# Patient Record
Sex: Male | Born: 1995
Health system: Southern US, Community
[De-identification: ages and names within clinical notes are randomized; demographics above are authoritative.]

## PROBLEM LIST (undated history)

## (undated) DIAGNOSIS — J45909 Unspecified asthma, uncomplicated: Secondary | ICD-10-CM

## (undated) DIAGNOSIS — L309 Dermatitis, unspecified: Secondary | ICD-10-CM

## (undated) HISTORY — PX: NO PAST SURGERIES: SHX2092

## (undated) HISTORY — DX: Unspecified asthma, uncomplicated: J45.909

---
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
## 2015-07-07 ENCOUNTER — Telehealth: Payer: Self-pay

## 2015-07-07 NOTE — Telephone Encounter (Signed)
Attempted pre visit call, mailbox was full.

## 2015-07-09 ENCOUNTER — Ambulatory Visit (INDEPENDENT_AMBULATORY_CARE_PROVIDER_SITE_OTHER): Payer: 59 | Admitting: Physician Assistant

## 2015-07-09 ENCOUNTER — Encounter: Payer: Self-pay | Admitting: Physician Assistant

## 2015-07-09 VITALS — BP 100/56 | HR 66 | Temp 98.1°F | Ht 68.25 in | Wt 212.4 lb

## 2015-07-09 DIAGNOSIS — J4520 Mild intermittent asthma, uncomplicated: Secondary | ICD-10-CM

## 2015-07-09 DIAGNOSIS — L700 Acne vulgaris: Secondary | ICD-10-CM

## 2015-07-09 NOTE — Progress Notes (Signed)
   Patient presents to clinic today to establish care.  Acute Concerns: Patient c/o acne of chest and back that has been present over the past couple of years but worsening over the past year. Has not tried anything for symptoms but has increased water intake.  Chronic Issues: Patient with history of asthma, mild intermittent. Denies nighttime awakenings. Uses albuterol inhaler 2-3 times per year. Denies exercise-induced symptoms. Symptoms usually only flare-up with exposure to cold air.  Past Medical History  Diagnosis Date  . Asthma     very mild -- rare albuterol use    Past Surgical History  Procedure Laterality Date  . No past surgeries      No current outpatient prescriptions on file prior to visit.   No current facility-administered medications on file prior to visit.    No Known Allergies  Family History  Problem Relation Age of Onset  . Healthy Mother   . Healthy Father   . Healthy Brother     x 4  . Healthy Sister     x 2    History   Social History  . Marital Status: Single    Spouse Name: N/A  . Number of Children: 0  . Years of Education: N/A   Occupational History  . Student     Primary school teacher   Social History Main Topics  . Smoking status: Never Smoker   . Smokeless tobacco: Never Used  . Alcohol Use: No  . Drug Use: No  . Sexual Activity:    Partners: Female     Comment: condoms   Other Topics Concern  . Not on file   Social History Narrative   Review of Systems  Constitutional: Negative for fever, weight loss and malaise/fatigue.  Respiratory: Negative for cough, hemoptysis, sputum production, shortness of breath and wheezing.   Cardiovascular: Negative for chest pain and palpitations.  Skin: Negative for itching and rash.    BP 100/56 mmHg  Pulse 66  Temp(Src) 98.1 F (36.7 C) (Oral)  Ht 5' 8.25" (1.734 m)  Wt 212 lb 6.4 oz (96.344 kg)  BMI 32.04 kg/m2  SpO2 98%  Physical Exam  Constitutional: He is oriented to person,  place, and time and well-developed, well-nourished, and in no distress.  HENT:  Head: Normocephalic and atraumatic.  Eyes: Conjunctivae are normal.  Neck: Neck supple.  Cardiovascular: Normal rate, regular rhythm, normal heart sounds and intact distal pulses.   Pulmonary/Chest: Effort normal and breath sounds normal. No respiratory distress. He has no wheezes. He has no rales. He exhibits no tenderness.  Neurological: He is alert and oriented to person, place, and time.  Skin: Skin is warm and dry.  Pustular acne noted on chest and upper back. No evidence of scarring noted.  Vitals reviewed.   No results found for this or any previous visit (from the past 2160 hour(s)).  Assessment/Plan: Asthma, mild intermittent, well-controlled Continue albuterol inhaler PRN.  Acne vulgaris Has never tried OTC acne medications. Recommend washing with Cetaphil Antibacterial soap twice weekly at bath time. Use Neutrogena or other brand of acne body wash daily. Continue good hydration with water. Follow-up if not improving.

## 2015-07-09 NOTE — Patient Instructions (Signed)
Please stay well hydrated as this will help with acne.  Start using Cetaphil Antibacterial wash twice weekly. Also get some Neutrogena Acen body wash and use as directed. Try this regimen for 1-2 months. If no improvement, come back and see me and we can discuss prescription medications.  For the knee this is arthritis from playing football (all the wear and tear/impact on your joints has effects!). Apply some Aspercreme or Icy Hot to the knees before practices/games. Aleve if needed for pain. If symptoms worsen, we will need to x-ray your knees.  Follow-up with me yearly for a physical and when you need me for sick visits.

## 2015-07-09 NOTE — Assessment & Plan Note (Signed)
Continue albuterol inhaler PRN.

## 2015-07-09 NOTE — Assessment & Plan Note (Signed)
Has never tried OTC acne medications. Recommend washing with Cetaphil Antibacterial soap twice weekly at bath time. Use Neutrogena or other brand of acne body wash daily. Continue good hydration with water. Follow-up if not improving.

## 2015-07-09 NOTE — Progress Notes (Signed)
Pre visit review using our clinic review tool, if applicable. No additional management support is needed unless otherwise documented below in the visit note. 

## 2016-07-04 ENCOUNTER — Ambulatory Visit: Payer: Self-pay | Admitting: Physician Assistant

## 2016-07-05 ENCOUNTER — Encounter: Payer: Self-pay | Admitting: Physician Assistant

## 2016-07-05 ENCOUNTER — Ambulatory Visit (INDEPENDENT_AMBULATORY_CARE_PROVIDER_SITE_OTHER): Payer: 59 | Admitting: Physician Assistant

## 2016-07-05 VITALS — BP 102/70 | HR 55 | Temp 97.7°F | Resp 16 | Ht 68.0 in | Wt 202.2 lb

## 2016-07-05 DIAGNOSIS — Z114 Encounter for screening for human immunodeficiency virus [HIV]: Secondary | ICD-10-CM | POA: Diagnosis not present

## 2016-07-05 DIAGNOSIS — Z0000 Encounter for general adult medical examination without abnormal findings: Secondary | ICD-10-CM | POA: Diagnosis not present

## 2016-07-05 LAB — URINALYSIS, ROUTINE W REFLEX MICROSCOPIC
Bilirubin Urine: NEGATIVE
HGB URINE DIPSTICK: NEGATIVE
Ketones, ur: NEGATIVE
LEUKOCYTES UA: NEGATIVE
Nitrite: NEGATIVE
RBC / HPF: NONE SEEN (ref 0–?)
Specific Gravity, Urine: 1.02 (ref 1.000–1.030)
TOTAL PROTEIN, URINE-UPE24: NEGATIVE
Urine Glucose: NEGATIVE
Urobilinogen, UA: 0.2 (ref 0.0–1.0)
WBC UA: NONE SEEN (ref 0–?)
pH: 6.5 (ref 5.0–8.0)

## 2016-07-05 LAB — COMPREHENSIVE METABOLIC PANEL
ALT: 16 U/L (ref 0–53)
AST: 16 U/L (ref 0–37)
Albumin: 4.2 g/dL (ref 3.5–5.2)
Alkaline Phosphatase: 56 U/L (ref 39–117)
BILIRUBIN TOTAL: 0.6 mg/dL (ref 0.2–1.2)
BUN: 11 mg/dL (ref 6–23)
CALCIUM: 9.4 mg/dL (ref 8.4–10.5)
CHLORIDE: 103 meq/L (ref 96–112)
CO2: 30 meq/L (ref 19–32)
Creatinine, Ser: 1.18 mg/dL (ref 0.40–1.50)
GFR: 101.04 mL/min (ref >60.00)
Glucose, Bld: 92 mg/dL (ref 70–99)
Potassium: 4.2 mEq/L (ref 3.5–5.1)
Sodium: 137 mEq/L (ref 135–145)
Total Protein: 7 g/dL (ref 6.0–8.3)

## 2016-07-05 LAB — CBC
HEMATOCRIT: 43.4 % (ref 39.0–52.0)
HEMOGLOBIN: 14.6 g/dL (ref 13.0–17.0)
MCHC: 33.8 g/dL (ref 30.0–36.0)
MCV: 84.6 fl (ref 78.0–100.0)
PLATELETS: 285 10*3/uL (ref 150.0–400.0)
RBC: 5.13 Mil/uL (ref 4.22–5.81)
RDW: 13.9 % (ref 11.5–14.6)
WBC: 4.5 10*3/uL (ref 4.5–10.5)

## 2016-07-05 LAB — LIPID PANEL
CHOL/HDL RATIO: 3
CHOLESTEROL: 122 mg/dL (ref 0–200)
HDL: 38.4 mg/dL — AB (ref >39.00)
LDL CALC: 74 mg/dL (ref 0–99)
NonHDL: 83.22
TRIGLYCERIDES: 47 mg/dL (ref 0.0–149.0)
VLDL: 9.4 mg/dL (ref 0.0–40.0)

## 2016-07-05 MED ORDER — PROAIR HFA 108 (90 BASE) MCG/ACT IN AERS: 1.0000 | INHALATION_SPRAY | Freq: Four times a day (QID) | RESPIRATORY_TRACT | 1 refills | Status: DC | PRN

## 2016-07-05 NOTE — Patient Instructions (Signed)
Please go to the lab for blood work.   Our office will call you with your results unless you have chosen to receive results via MyChart.  If your blood work is normal we will follow-up each year for physicals and as scheduled for chronic medical problems.  If anything is abnormal we will treat accordingly and get you in for a follow-up.  Keep up with exercise and follow the meal planning guide I have given you to help promote healthy weight.  Preventive Care for Adults, Male A healthy lifestyle and preventive care can promote health and wellness. Preventive health guidelines for men include the following key practices:  A routine yearly physical is a good way to check with your health care provider about your health and preventative screening. It is a chance to share any concerns and updates on your health and to receive a thorough exam.  Visit your dentist for a routine exam and preventative care every 6 months. Brush your teeth twice a day and floss once a day. Good oral hygiene prevents tooth decay and gum disease.  The frequency of eye exams is based on your age, health, family medical history, use of contact lenses, and other factors. Follow your health care provider's recommendations for frequency of eye exams.  Eat a healthy diet. Foods such as vegetables, fruits, whole grains, low-fat dairy products, and lean protein foods contain the nutrients you need without too many calories. Decrease your intake of foods high in solid fats, added sugars, and salt. Eat the right amount of calories for you.Get information about a proper diet from your health care provider, if necessary.  Regular physical exercise is one of the most important things you can do for your health. Most adults should get at least 150 minutes of moderate-intensity exercise (any activity that increases your heart rate and causes you to sweat) each week. In addition, most adults need muscle-strengthening exercises on 2 or  more days a week.  Maintain a healthy weight. The body mass index (BMI) is a screening tool to identify possible weight problems. It provides an estimate of body fat based on height and weight. Your health care provider can find your BMI and can help you achieve or maintain a healthy weight.For adults 20 years and older:  A BMI below 18.5 is considered underweight.  A BMI of 18.5 to 24.9 is normal.  A BMI of 25 to 29.9 is considered overweight.  A BMI of 30 and above is considered obese.  Maintain normal blood lipids and cholesterol levels by exercising and minimizing your intake of saturated fat. Eat a balanced diet with plenty of fruit and vegetables. Blood tests for lipids and cholesterol should begin at age 76 and be repeated every 5 years. If your lipid or cholesterol levels are high, you are over 50, or you are at high risk for heart disease, you may need your cholesterol levels checked more frequently.Ongoing high lipid and cholesterol levels should be treated with medicines if diet and exercise are not working.  If you smoke, find out from your health care provider how to quit. If you do not use tobacco, do not start.  Lung cancer screening is recommended for adults aged 55-80 years who are at high risk for developing lung cancer because of a history of smoking. A yearly low-dose CT scan of the lungs is recommended for people who have at least a 30-pack-year history of smoking and are a current smoker or have quit within the past  15 years. A pack year of smoking is smoking an average of 1 pack of cigarettes a day for 1 year (for example: 1 pack a day for 30 years or 2 packs a day for 15 years). Yearly screening should continue until the smoker has stopped smoking for at least 15 years. Yearly screening should be stopped for people who develop a health problem that would prevent them from having lung cancer treatment.  If you choose to drink alcohol, do not have more than 2 drinks per day.  One drink is considered to be 12 ounces (355 mL) of beer, 5 ounces (148 mL) of wine, or 1.5 ounces (44 mL) of liquor.  Avoid use of street drugs. Do not share needles with anyone. Ask for help if you need support or instructions about stopping the use of drugs.  High blood pressure causes heart disease and increases the risk of stroke. Your blood pressure should be checked at least every 1-2 years. Ongoing high blood pressure should be treated with medicines, if weight loss and exercise are not effective.  If you are 100-12 years old, ask your health care provider if you should take aspirin to prevent heart disease.  Diabetes screening is done by taking a blood sample to check your blood glucose level after you have not eaten for a certain period of time (fasting). If you are not overweight and you do not have risk factors for diabetes, you should be screened once every 3 years starting at age 61. If you are overweight or obese and you are 3-27 years of age, you should be screened for diabetes every year as part of your cardiovascular risk assessment.  Colorectal cancer can be detected and often prevented. Most routine colorectal cancer screening begins at the age of 71 and continues through age 25. However, your health care provider may recommend screening at an earlier age if you have risk factors for colon cancer. On a yearly basis, your health care provider may provide home test kits to check for hidden blood in the stool. Use of a small camera at the end of a tube to directly examine the colon (sigmoidoscopy or colonoscopy) can detect the earliest forms of colorectal cancer. Talk to your health care provider about this at age 39, when routine screening begins. Direct exam of the colon should be repeated every 5-10 years through age 13, unless early forms of precancerous polyps or small growths are found.  People who are at an increased risk for hepatitis B should be screened for this virus. You are  considered at high risk for hepatitis B if:  You were born in a country where hepatitis B occurs often. Talk with your health care provider about which countries are considered high risk.  Your parents were born in a high-risk country and you have not received a shot to protect against hepatitis B (hepatitis B vaccine).  You have HIV or AIDS.  You use needles to inject street drugs.  You live with, or have sex with, someone who has hepatitis B.  You are a man who has sex with other men (MSM).  You get hemodialysis treatment.  You take certain medicines for conditions such as cancer, organ transplantation, and autoimmune conditions.  Hepatitis C blood testing is recommended for all people born from [birth year] through 1965 and any individual with known risks for hepatitis C.  Practice safe sex. Use condoms and avoid high-risk sexual practices to reduce the spread of sexually transmitted infections (STIs).  STIs include gonorrhea, chlamydia, syphilis, trichomonas, herpes, HPV, and human immunodeficiency virus (HIV). Herpes, HIV, and HPV are viral illnesses that have no cure. They can result in disability, cancer, and death.  If you are a man who has sex with other men, you should be screened at least once per year for:  HIV.  Urethral, rectal, and pharyngeal infection of gonorrhea, chlamydia, or both.  If you are at risk of being infected with HIV, it is recommended that you take a prescription medicine daily to prevent HIV infection. This is called preexposure prophylaxis (PrEP). You are considered at risk if:  You are a man who has sex with other men (MSM) and have other risk factors.  You are a heterosexual man, are sexually active, and are at increased risk for HIV infection.  You take drugs by injection.  You are sexually active with a partner who has HIV.  Talk with your health care provider about whether you are at high risk of being infected with HIV. If you choose to begin PrEP,  you should first be tested for HIV. You should then be tested every 3 months for as long as you are taking PrEP.  A one-time screening for abdominal aortic aneurysm (AAA) and surgical repair of large AAAs by ultrasound are recommended for men ages 80 to 34 years who are current or former smokers.  Healthy men should no longer receive prostate-specific antigen (PSA) blood tests as part of routine cancer screening. Talk with your health care provider about prostate cancer screening.  Testicular cancer screening is not recommended for adult males who have no symptoms. Screening includes self-exam, a health care provider exam, and other screening tests. Consult with your health care provider about any symptoms you have or any concerns you have about testicular cancer.  Use sunscreen. Apply sunscreen liberally and repeatedly throughout the day. You should seek shade when your shadow is shorter than you. Protect yourself by wearing long sleeves, pants, a wide-brimmed hat, and sunglasses year round, whenever you are outdoors.  Once a month, do a whole-body skin exam, using a mirror to look at the skin on your back. Tell your health care provider about new moles, moles that have irregular borders, moles that are larger than a pencil eraser, or moles that have changed in shape or color.  Stay current with required vaccines (immunizations).  Influenza vaccine. All adults should be immunized every year.  Tetanus, diphtheria, and acellular pertussis (Td, Tdap) vaccine. An adult who has not previously received Tdap or who does not know his vaccine status should receive 1 dose of Tdap. This initial dose should be followed by tetanus and diphtheria toxoids (Td) booster doses every 10 years. Adults with an unknown or incomplete history of completing a 3-dose immunization series with Td-containing vaccines should begin or complete a primary immunization series including a Tdap dose. Adults should receive a Td booster  every 10 years.  Varicella vaccine. An adult without evidence of immunity to varicella should receive 2 doses or a second dose if he has previously received 1 dose.  Human papillomavirus (HPV) vaccine. Males aged 11-21 years who have not received the vaccine previously should receive the 3-dose series. Males aged 22-26 years may be immunized. Immunization is recommended through the age of 79 years for any male who has sex with males and did not get any or all doses earlier. Immunization is recommended for any person with an immunocompromised condition through the age of 26 years if he  did not get any or all doses earlier. During the 3-dose series, the second dose should be obtained 4-8 weeks after the first dose. The third dose should be obtained 24 weeks after the first dose and 16 weeks after the second dose.  Zoster vaccine. One dose is recommended for adults aged 71 years or older unless certain conditions are present.  Measles, mumps, and rubella (MMR) vaccine. Adults born before 2 generally are considered immune to measles and mumps. Adults born in [birth year] or later should have 1 or more doses of MMR vaccine unless there is a contraindication to the vaccine or there is laboratory evidence of immunity to each of the three diseases. A routine second dose of MMR vaccine should be obtained at least 28 days after the first dose for students attending postsecondary schools, health care workers, or international travelers. People who received inactivated measles vaccine or an unknown type of measles vaccine during 1963-1967 should receive 2 doses of MMR vaccine. People who received inactivated mumps vaccine or an unknown type of mumps vaccine before 1979 and are at high risk for mumps infection should consider immunization with 2 doses of MMR vaccine. Unvaccinated health care workers born before [birth year] who lack laboratory evidence of measles, mumps, or rubella immunity or laboratory confirmation of disease  should consider measles and mumps immunization with 2 doses of MMR vaccine or rubella immunization with 1 dose of MMR vaccine.  Pneumococcal 13-valent conjugate (PCV13) vaccine. When indicated, a person who is uncertain of his immunization history and has no record of immunization should receive the PCV13 vaccine. All adults 23 years of age and older should receive this vaccine. An adult aged 6 years or older who has certain medical conditions and has not been previously immunized should receive 1 dose of PCV13 vaccine. This PCV13 should be followed with a dose of pneumococcal polysaccharide (PPSV23) vaccine. Adults who are at high risk for pneumococcal disease should obtain the PPSV23 vaccine at least 8 weeks after the dose of PCV13 vaccine. Adults older than 20 years of age who have normal immune system function should obtain the PPSV23 vaccine dose at least 1 year after the dose of PCV13 vaccine.  Pneumococcal polysaccharide (PPSV23) vaccine. When PCV13 is also indicated, PCV13 should be obtained first. All adults aged 58 years and older should be immunized. An adult younger than age 32 years who has certain medical conditions should be immunized. Any person who resides in a nursing home or long-term care facility should be immunized. An adult smoker should be immunized. People with an immunocompromised condition and certain other conditions should receive both PCV13 and PPSV23 vaccines. People with human immunodeficiency virus (HIV) infection should be immunized as soon as possible after diagnosis. Immunization during chemotherapy or radiation therapy should be avoided. Routine use of PPSV23 vaccine is not recommended for American Indians, [address], or people younger than 65 years unless there are medical conditions that require PPSV23 vaccine. When indicated, people who have unknown immunization and have no record of immunization should receive PPSV23 vaccine. One-time revaccination 5 years after the  first dose of PPSV23 is recommended for people aged 19-64 years who have chronic kidney failure, nephrotic syndrome, asplenia, or immunocompromised conditions. People who received 1-2 doses of PPSV23 before age 11 years should receive another dose of PPSV23 vaccine at age 30 years or later if at least 5 years have passed since the previous dose. Doses of PPSV23 are not needed for people immunized with PPSV23 at or after  age 20 years.  Meningococcal vaccine. Adults with asplenia or persistent complement component deficiencies should receive 2 doses of quadrivalent meningococcal conjugate (MenACWY-D) vaccine. The doses should be obtained at least 2 months apart. Microbiologists working with certain meningococcal bacteria, [employer] recruits, people at risk during an outbreak, and people who travel to or live in countries with a high rate of meningitis should be immunized. A first-year college student up through age 49 years who is living in a residence hall should receive a dose if he did not receive a dose on or after his 16th birthday. Adults who have certain high-risk conditions should receive one or more doses of vaccine.  Hepatitis A vaccine. Adults who wish to be protected from this disease, have chronic liver disease, work with hepatitis A-infected animals, work in hepatitis A research labs, or travel to or work in countries with a high rate of hepatitis A should be immunized. Adults who were previously unvaccinated and who anticipate close contact with an international adoptee during the first 60 days after arrival in the [place] from a country with a high rate of hepatitis A should be immunized.  Hepatitis B vaccine. Adults should be immunized if they wish to be protected from this disease, are under age 74 years and have diabetes, have chronic liver disease, have had more than one sex partner in the past 6 months, may be exposed to blood or other infectious body fluids, are household contacts or  sex partners of hepatitis B positive people, are clients or workers in certain care facilities, or travel to or work in countries with a high rate of hepatitis B.  Haemophilus influenzae type b (Hib) vaccine. A previously unvaccinated person with asplenia or sickle cell disease or having a scheduled splenectomy should receive 1 dose of Hib vaccine. Regardless of previous immunization, a recipient of a hematopoietic stem cell transplant should receive a 3-dose series 6-12 months after his successful transplant. Hib vaccine is not recommended for adults with HIV infection. Preventive Service / Frequency Ages 23 to 61  Blood pressure check.** / Every 3-5 years.  Lipid and cholesterol check.** / Every 5 years beginning at age 24.  Hepatitis C blood test.** / For any individual with known risks for hepatitis C.  Skin self-exam. / Monthly.  Influenza vaccine. / Every year.  Tetanus, diphtheria, and acellular pertussis (Tdap, Td) vaccine.** / Consult your health care provider. 1 dose of Td every 10 years.  Varicella vaccine.** / Consult your health care provider.  HPV vaccine. / 3 doses over 6 months, if 54 or younger.  Measles, mumps, rubella (MMR) vaccine.** / You need at least 1 dose of MMR if you were born in 1957 or later. You may also need a second dose.  Pneumococcal 13-valent conjugate (PCV13) vaccine.** / Consult your health care provider.  Pneumococcal polysaccharide (PPSV23) vaccine.** / 1 to 2 doses if you smoke cigarettes or if you have certain conditions.  Meningococcal vaccine.** / 1 dose if you are age 6 to 4 years and a [employer] researcher living in a residence hall, or have one of several medical conditions. You may also need additional booster doses.  Hepatitis A vaccine.** / Consult your health care provider.  Hepatitis B vaccine.** / Consult your health care provider.  Haemophilus influenzae type b (Hib) vaccine.** / Consult your health care provider. Ages 51  to 1  Blood pressure check.** / Every year.  Lipid and cholesterol check.** / Every 5 years beginning at age 77.  Lung cancer screening. / Every year if you are aged 33-80 years and have a 30-pack-year history of smoking and currently smoke or have quit within the past 15 years. Yearly screening is stopped once you have quit smoking for at least 15 years or develop a health problem that would prevent you from having lung cancer treatment.  Fecal occult blood test (FOBT) of stool. / Every year beginning at age 4 and continuing until age 39. You may not have to do this test if you get a colonoscopy every 10 years.  Flexible sigmoidoscopy** or colonoscopy.** / Every 5 years for a flexible sigmoidoscopy or every 10 years for a colonoscopy beginning at age 23 and continuing until age 69.  Hepatitis C blood test.** / For all people born from [birth year] through 1965 and any individual with known risks for hepatitis C.  Skin self-exam. / Monthly.  Influenza vaccine. / Every year.  Tetanus, diphtheria, and acellular pertussis (Tdap/Td) vaccine.** / Consult your health care provider. 1 dose of Td every 10 years.  Varicella vaccine.** / Consult your health care provider.  Zoster vaccine.** / 1 dose for adults aged 33 years or older.  Measles, mumps, rubella (MMR) vaccine.** / You need at least 1 dose of MMR if you were born in 1957 or later. You may also need a second dose.  Pneumococcal 13-valent conjugate (PCV13) vaccine.** / Consult your health care provider.  Pneumococcal polysaccharide (PPSV23) vaccine.** / 1 to 2 doses if you smoke cigarettes or if you have certain conditions.  Meningococcal vaccine.** / Consult your health care provider.  Hepatitis A vaccine.** / Consult your health care provider.  Hepatitis B vaccine.** / Consult your health care provider.  Haemophilus influenzae type b (Hib) vaccine.** / Consult your health care provider. Ages 66 and over  Blood pressure check.** /  Every year.  Lipid and cholesterol check.**/ Every 5 years beginning at age 30.  Lung cancer screening. / Every year if you are aged 76-80 years and have a 30-pack-year history of smoking and currently smoke or have quit within the past 15 years. Yearly screening is stopped once you have quit smoking for at least 15 years or develop a health problem that would prevent you from having lung cancer treatment.  Fecal occult blood test (FOBT) of stool. / Every year beginning at age 71 and continuing until age 8. You may not have to do this test if you get a colonoscopy every 10 years.  Flexible sigmoidoscopy** or colonoscopy.** / Every 5 years for a flexible sigmoidoscopy or every 10 years for a colonoscopy beginning at age 66 and continuing until age 25.  Hepatitis C blood test.** / For all people born from [birth year] through 1965 and any individual with known risks for hepatitis C.  Abdominal aortic aneurysm (AAA) screening.** / A one-time screening for ages 3 to 61 years who are current or former smokers.  Skin self-exam. / Monthly.  Influenza vaccine. / Every year.  Tetanus, diphtheria, and acellular pertussis (Tdap/Td) vaccine.** / 1 dose of Td every 10 years.  Varicella vaccine.** / Consult your health care provider.  Zoster vaccine.** / 1 dose for adults aged 37 years or older.  Pneumococcal 13-valent conjugate (PCV13) vaccine.** / 1 dose for all adults aged 29 years and older.  Pneumococcal polysaccharide (PPSV23) vaccine.** / 1 dose for all adults aged 48 years and older.  Meningococcal vaccine.** / Consult your health care provider.  Hepatitis A vaccine.** / Consult your health care provider.  Hepatitis  B vaccine.** / Consult your health care provider.  Haemophilus influenzae type b (Hib) vaccine.** / Consult your health care provider. **Family history and personal history of risk and conditions may change your health care provider's recommendations.   This information is not  intended to replace advice given to you by your health care provider. Make sure you discuss any questions you have with your health care provider.   Document Released: 01/23/2002 Document Revised: 12/18/2014 Document Reviewed: 04/24/2011 Elsevier Interactive Patient Education [employer].

## 2016-07-05 NOTE — Assessment & Plan Note (Signed)
Depression screen negative. Health Maintenance reviewed -- Immunizations up-to-date per patient. Will obtain routine HIV screen - average risk. Preventive schedule discussed and handout given in AVS. Will obtain fasting labs today.

## 2016-07-05 NOTE — Progress Notes (Signed)
Patient presents to clinic today for annual exam.  Patient is fasting for labs. Body mass index is 30.75 kg/m. Is currently working out for football season. Is watching diet.   Chronic Issues: Asthma -- mild, intermittent. No history of exacerbation. Denies nighttime symptoms. Has not had flare in over a year. Does keep rescue inhaler on hand. Does need refill.   Health Maintenance: Vision -- overdue. Denies decreased acuity of vision. Does not wear corrective lenses. Immunizations -- TDap up-to-date per patient. HIV Screening -- Endorses prior testing within 1 year. Is sexually active with women, multiple partners.    Past Medical History:  Diagnosis Date  . Asthma    very mild -- rare albuterol use    Past Surgical History:  Procedure Laterality Date  . NO PAST SURGERIES      No current outpatient prescriptions on file prior to visit.   No current facility-administered medications on file prior to visit.     No Known Allergies  Family History  Problem Relation Age of Onset  . Healthy Mother   . Healthy Father   . Healthy Brother     x 4  . Healthy Sister     x 2    Social History   Social History  . Marital status: Single    Spouse name: N/A  . Number of children: 0  . Years of education: N/A   Occupational History  . Student     Primary school teacher   Social History Main Topics  . Smoking status: Never Smoker  . Smokeless tobacco: Never Used  . Alcohol use No  . Drug use: No  . Sexual activity: Yes    Partners: Female     Comment: condoms   Other Topics Concern  . Not on file   Social History Narrative  . No narrative on file    Review of Systems  Constitutional: Negative for fever and weight loss.  HENT: Negative for ear discharge, ear pain, hearing loss and tinnitus.   Eyes: Negative for blurred vision, double vision, photophobia and pain.  Respiratory: Negative for cough and shortness of breath.   Cardiovascular: Negative for chest pain  and palpitations.  Gastrointestinal: Negative for abdominal pain, blood in stool, constipation, diarrhea, heartburn, melena, nausea and vomiting.  Genitourinary: Negative for dysuria, flank pain, frequency, hematuria and urgency.  Musculoskeletal: Negative for falls.  Neurological: Negative for dizziness, loss of consciousness and headaches.  Endo/Heme/Allergies: Negative for environmental allergies.  Psychiatric/Behavioral: Negative for depression, hallucinations, substance abuse and suicidal ideas. The patient is not nervous/anxious and does not have insomnia.     BP 102/70 (BP Location: Right Arm, Patient Position: Sitting, Cuff Size: Large)   Pulse (!) 55   Temp 97.7 F (36.5 C) (Oral)   Resp 16   Ht 5\' 8"  (1.727 m)   Wt 202 lb 4 oz (91.7 kg)   SpO2 97%   BMI 30.75 kg/m   Physical Exam  Constitutional: He is oriented to person, place, and time and well-developed, well-nourished, and in no distress.  HENT:  Head: Normocephalic and atraumatic.  Right Ear: External ear normal.  Left Ear: External ear normal.  Nose: Nose normal.  Mouth/Throat: Oropharynx is clear and moist. No oropharyngeal exudate.  Eyes: Conjunctivae and EOM are normal. Pupils are equal, round, and reactive to light.  Neck: Neck supple. No thyromegaly present.  Cardiovascular: Normal rate, regular rhythm, normal heart sounds and intact distal pulses.   Pulmonary/Chest: Effort normal and breath  sounds normal. No respiratory distress. He has no wheezes. He has no rales. He exhibits no tenderness.  Abdominal: Soft. Bowel sounds are normal. He exhibits no distension and no mass. There is no tenderness. There is no rebound and no guarding.  Genitourinary: Testes/scrotum normal.  Lymphadenopathy:    He has no cervical adenopathy.  Neurological: He is alert and oriented to person, place, and time.  Skin: Skin is warm and dry. No rash noted.  Psychiatric: Affect normal.  Vitals reviewed.  Assessment/Plan: Visit  for preventive health examination Depression screen negative. Health Maintenance reviewed -- Immunizations up-to-date per patient. Will obtain routine HIV screen - average risk. Preventive schedule discussed and handout given in AVS. Will obtain fasting labs today.     Piedad Climes, PA-C

## 2016-07-06 ENCOUNTER — Encounter: Payer: Self-pay | Admitting: Physician Assistant

## 2016-07-06 LAB — HIV ANTIBODY (ROUTINE TESTING W REFLEX): HIV: NONREACTIVE

## 2016-09-15 ENCOUNTER — Ambulatory Visit (INDEPENDENT_AMBULATORY_CARE_PROVIDER_SITE_OTHER): Payer: 59 | Admitting: Physician Assistant

## 2016-09-15 ENCOUNTER — Encounter: Payer: Self-pay | Admitting: Physician Assistant

## 2016-09-15 VITALS — BP 100/66 | Temp 98.2°F | Resp 16 | Ht 68.0 in | Wt 195.5 lb

## 2016-09-15 DIAGNOSIS — R41840 Attention and concentration deficit: Secondary | ICD-10-CM | POA: Diagnosis not present

## 2016-09-15 DIAGNOSIS — B9789 Other viral agents as the cause of diseases classified elsewhere: Secondary | ICD-10-CM | POA: Diagnosis not present

## 2016-09-15 DIAGNOSIS — J069 Acute upper respiratory infection, unspecified: Secondary | ICD-10-CM

## 2016-09-15 DIAGNOSIS — L2082 Flexural eczema: Secondary | ICD-10-CM

## 2016-09-15 MED ORDER — TRIAMCINOLONE ACETONIDE 0.1 % EX CREA: 1.0000 "application " | TOPICAL_CREAM | Freq: Two times a day (BID) | CUTANEOUS | 0 refills | Status: DC

## 2016-09-15 NOTE — Patient Instructions (Signed)
Please start the Kenalog as directed twice daily for 2 weeks. Continue with moisturizing cream and vaseline. Use gentle soaps -- non-scented and non-dyed.   Please stay well hydrated. Use some saline nasal spray.  Start some over-the-counter Mucinex-DM for cough and congestion.  Please call the number on the form I have given you to set up an appointment for ADD screening.

## 2016-09-15 NOTE — Progress Notes (Signed)
Patient presents to clinic today to discuss multiple issues.  Patient endorses history of eczema in his youth. Has done well over the past few years but now has noted flare up of dry skin of flexural surfaces of his arms bilaterally and neck. Has been using Vaseline with little relief in symptoms.   Patient endorses 1 days of rhinorrhea with dry cough since yesterday evening. Endorses some sinus congestion with R ear pressure. Denies sinus pain, fever, chills, chest congestion or SOB. Denies recent travel or sick contact.  Patient would also like evaluation with ADD. Endorses grades are doing ok overall (in college). Endorses attention span is significantly worsened -- notes focusing better in certain environments. Endorses history of ADD in childhood but denies any treatment since childhood. Does currently smoke marijuana. Denies other illicit drug use.   Past Medical History:  Diagnosis Date  . Asthma    very mild -- rare albuterol use    Current Outpatient Prescriptions on File Prior to Visit  Medication Sig Dispense Refill  . PROAIR HFA 108 (90 Base) MCG/ACT inhaler Inhale 1-2 puffs into the lungs every 6 (six) hours as needed for wheezing or shortness of breath. INHALE 1 TO 2 PUFFS BY MOUTH EVERY 4-6 HOURS AS NEEDED FOR WHEEZING. 18 g 1   No current facility-administered medications on file prior to visit.     No Known Allergies  Family History  Problem Relation Age of Onset  . Healthy Mother   . Healthy Father   . Healthy Brother     x 4  . Healthy Sister     x 2    Social History   Social History  . Marital status: Single    Spouse name: N/A  . Number of children: 0  . Years of education: N/A   Occupational History  . Student     Primary school teacherGraphic Design   Social History Main Topics  . Smoking status: Never Smoker  . Smokeless tobacco: Never Used  . Alcohol use No  . Drug use: No  . Sexual activity: Yes    Partners: Female     Comment: condoms   Other Topics  Concern  . None   Social History Narrative  . None   Review of Systems - See HPI.  All other ROS are negative.  BP 100/66 (BP Location: Right Arm, Patient Position: Sitting, Cuff Size: Large)   Temp 98.2 F (36.8 C) (Oral)   Resp 16   Ht 5\' 8"  (1.727 m)   Wt 195 lb 8 oz (88.7 kg)   SpO2 99%   BMI 29.73 kg/m   Physical Exam  Constitutional: He is oriented to person, place, and time and well-developed, well-nourished, and in no distress.  HENT:  Head: Normocephalic and atraumatic.  Right Ear: External ear normal.  Left Ear: External ear normal.  Nose: Nose normal.  Mouth/Throat: Oropharynx is clear and moist. No oropharyngeal exudate.  TM within normal limits.  Eyes: Conjunctivae are normal. Pupils are equal, round, and reactive to light.  Neck: Neck supple.  Cardiovascular: Normal rate, regular rhythm, normal heart sounds and intact distal pulses.   Pulmonary/Chest: Effort normal and breath sounds normal. No respiratory distress. He has no wheezes. He has no rales. He exhibits no tenderness.  Neurological: He is alert and oriented to person, place, and time.  Skin: Skin is warm and dry.  Thickened, dry and scaling regions of flexural surfaces of bilateral upper and lower extremities.  Psychiatric: Affect normal.  Vitals reviewed.   Recent Results (from the past 2160 hour(s))  CBC     Status: None   Collection Time: 07/05/16  8:45 AM  Result Value Ref Range   WBC 4.5 4.5 - 10.5 K/uL   RBC 5.13 4.22 - 5.81 Mil/uL   Platelets 285.0 150.0 - 400.0 K/uL   Hemoglobin 14.6 13.0 - 17.0 g/dL   HCT 16.1 09.6 - 04.5 %   MCV 84.6 78.0 - 100.0 fl   MCHC 33.8 30.0 - 36.0 g/dL   RDW 40.9 81.1 - 91.4 %  Comprehensive metabolic panel     Status: None   Collection Time: 07/05/16  8:45 AM  Result Value Ref Range   Sodium 137 135 - 145 mEq/L   Potassium 4.2 3.5 - 5.1 mEq/L   Chloride 103 96 - 112 mEq/L   CO2 30 19 - 32 mEq/L   Glucose, Bld 92 70 - 99 mg/dL   BUN 11 6 - 23 mg/dL    Creatinine, Ser 7.82 0.40 - 1.50 mg/dL   Total Bilirubin 0.6 0.2 - 1.2 mg/dL   Alkaline Phosphatase 56 39 - 117 U/L   AST 16 0 - 37 U/L   ALT 16 0 - 53 U/L   Total Protein 7.0 6.0 - 8.3 g/dL   Albumin 4.2 3.5 - 5.2 g/dL   Calcium 9.4 8.4 - 95.6 mg/dL   GFR 213.08 >65.78 mL/min  Lipid panel     Status: Abnormal   Collection Time: 07/05/16  8:45 AM  Result Value Ref Range   Cholesterol 122 0 - 200 mg/dL    Comment: ATP III Classification       Desirable:  < 200 mg/dL               Borderline High:  200 - 239 mg/dL          High:  > = 469 mg/dL   Triglycerides 62.9 0.0 - 149.0 mg/dL    Comment: Normal:  <528 mg/dLBorderline High:  150 - 199 mg/dL   HDL 41.32 (L) >44.01 mg/dL   VLDL 9.4 0.0 - 02.7 mg/dL   LDL Cholesterol 74 0 - 99 mg/dL   Total CHOL/HDL Ratio 3     Comment:                Men          Women1/2 Average Risk     3.4          3.3Average Risk          5.0          4.42X Average Risk          9.6          7.13X Average Risk          15.0          11.0                       NonHDL 83.22     Comment: NOTE:  Non-HDL goal should be 30 mg/dL higher than patient's LDL goal (i.e. LDL goal of < 70 mg/dL, would have non-HDL goal of < 100 mg/dL)  Urinalysis, Routine w reflex microscopic (not at [facility])     Status: None   Collection Time: 07/05/16  8:45 AM  Result Value Ref Range   Color, Urine YELLOW Yellow;Lt. Yellow   APPearance CLEAR Clear   Specific Gravity, Urine 1.020 1.000 - 1.030  pH 6.5 5.0 - 8.0   Total Protein, Urine NEGATIVE Negative   Urine Glucose NEGATIVE Negative   Ketones, ur NEGATIVE Negative   Bilirubin Urine NEGATIVE Negative   Hgb urine dipstick NEGATIVE Negative   Urobilinogen, UA 0.2 0.0 - 1.0   Leukocytes, UA NEGATIVE Negative   Nitrite NEGATIVE Negative   WBC, UA none seen 0-2/hpf   RBC / HPF none seen 0-2/hpf  HIV antibody (with reflex)     Status: None   Collection Time: 07/05/16  8:45 AM  Result Value Ref Range   HIV 1&2 Ab, 4th Generation  NONREACTIVE NONREACTIVE    Comment:   HIV-1 antigen and HIV-1/HIV-2 antibodies were not detected.  There is no laboratory evidence of HIV infection.   HIV-1/2 Antibody Diff        Not indicated. HIV-1 RNA, Qual TMA          Not indicated.     PLEASE NOTE: This information has been disclosed to you from records whose confidentiality may be protected by state law. If your state requires such protection, then the state law prohibits you from making any further disclosure of the information without the specific written consent of the person to whom it pertains, or as otherwise permitted by law. A general authorization for the release of medical or other information is NOT sufficient for this purpose.   The performance of this assay has not been clinically validated in patients less than 79 years old.   For additional information please refer to http://education.[employer].com/faq/FAQ106.  (This link is being provided for informational/educational purposes only.)       Assessment/Plan: 1. Flexural eczema Will start Kenalog cream. Discussed adequate hydration and moisturizing. Will follow.  - triamcinolone cream (KENALOG) 0.1 %; Apply 1 application topically 2 (two) times daily.  Dispense: 30 g; Refill: 0  2. Inattention Noted by patient. History of ADD in childhood. No treatment since middle school. Patient to schedule an appointment with PsyD for formal ADD testing.  3. Viral URI with cough Exam within normal limits. Supportive measures and OTC medications reviewed. FU if symptoms are not resolving.   Piedad Climes, PA-C

## 2017-02-19 ENCOUNTER — Emergency Department (HOSPITAL_BASED_OUTPATIENT_CLINIC_OR_DEPARTMENT_OTHER)
Admission: EM | Admit: 2017-02-19 | Discharge: 2017-02-20 | Disposition: A | Discharge status: Home / Self Care | Payer: 59 | Attending: Emergency Medicine | Admitting: Emergency Medicine

## 2017-02-19 ENCOUNTER — Encounter (HOSPITAL_BASED_OUTPATIENT_CLINIC_OR_DEPARTMENT_OTHER): Payer: Self-pay | Admitting: *Deleted

## 2017-02-19 ENCOUNTER — Emergency Department (HOSPITAL_BASED_OUTPATIENT_CLINIC_OR_DEPARTMENT_OTHER): Payer: 59

## 2017-02-19 DIAGNOSIS — L0231 Cutaneous abscess of buttock: Secondary | ICD-10-CM | POA: Diagnosis present

## 2017-02-19 DIAGNOSIS — Z79899 Other long term (current) drug therapy: Secondary | ICD-10-CM | POA: Diagnosis not present

## 2017-02-19 DIAGNOSIS — Y999 Unspecified external cause status: Secondary | ICD-10-CM | POA: Diagnosis not present

## 2017-02-19 DIAGNOSIS — Y939 Activity, unspecified: Secondary | ICD-10-CM | POA: Insufficient documentation

## 2017-02-19 DIAGNOSIS — S300XXA Contusion of lower back and pelvis, initial encounter: Secondary | ICD-10-CM | POA: Insufficient documentation

## 2017-02-19 DIAGNOSIS — J4520 Mild intermittent asthma, uncomplicated: Secondary | ICD-10-CM | POA: Diagnosis not present

## 2017-02-19 DIAGNOSIS — X58XXXA Exposure to other specified factors, initial encounter: Secondary | ICD-10-CM | POA: Insufficient documentation

## 2017-02-19 DIAGNOSIS — Y929 Unspecified place or not applicable: Secondary | ICD-10-CM | POA: Diagnosis not present

## 2017-02-19 MED ORDER — CLINDAMYCIN PHOSPHATE 600 MG/50ML IV SOLN
600.0000 mg | Freq: Once | INTRAVENOUS | Status: AC
  Administered 2017-02-20: 600 mg via INTRAVENOUS
  Filled 2017-02-19: qty 50

## 2017-02-19 MED ORDER — LIDOCAINE-EPINEPHRINE 2 %-1:100000 IJ SOLN
20.0000 mL | Freq: Once | INTRAMUSCULAR | Status: AC
  Administered 2017-02-19: 20 mL
  Filled 2017-02-19: qty 1

## 2017-02-19 MED ORDER — IOPAMIDOL (ISOVUE-300) INJECTION 61%
100.0000 mL | Freq: Once | INTRAVENOUS | Status: AC | PRN
  Administered 2017-02-19: 100 mL via INTRAVENOUS

## 2017-02-19 NOTE — ED Notes (Signed)
Pt. Has cyst noted on the L butt cheek with reports of pain upon sitting and pain with trying to sleep.
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
## 2017-02-19 NOTE — ED Notes (Signed)
Attempted 2 IV sticks without success 

## 2017-02-19 NOTE — ED Triage Notes (Signed)
Abscess to his buttocks for a week.

## 2017-02-20 MED ORDER — DOXYCYCLINE HYCLATE 100 MG PO CAPS: 100.0000 mg | ORAL_CAPSULE | Freq: Two times a day (BID) | ORAL | 0 refills | Status: DC

## 2017-02-20 MED ORDER — MORPHINE SULFATE (PF) 4 MG/ML IV SOLN: 4.0000 mg | Freq: Once | INTRAVENOUS | Status: DC

## 2017-02-20 MED ORDER — IBUPROFEN 800 MG PO TABS
800.0000 mg | ORAL_TABLET | Freq: Once | ORAL | Status: AC
  Administered 2017-02-20: 800 mg via ORAL
  Filled 2017-02-20: qty 1

## 2017-02-20 MED ORDER — DEXAMETHASONE SODIUM PHOSPHATE 10 MG/ML IJ SOLN: 10.0000 mg | Freq: Once | INTRAMUSCULAR | Status: DC

## 2017-02-20 MED ORDER — IBUPROFEN 800 MG PO TABS: 800.0000 mg | ORAL_TABLET | Freq: Three times a day (TID) | ORAL | 0 refills | Status: DC

## 2017-02-20 NOTE — ED Notes (Signed)
ED Provider at bedside. 

## 2017-02-20 NOTE — Discharge Instructions (Signed)
You have deep bruising to your left buttock.  Please change dressing to your buttock daily.  Take antibiotic as prescribed to decrease risk of infection.  Take ibuprofen for pain.  Return if you have any concerns.

## 2017-02-20 NOTE — ED Notes (Signed)
Pt verbalizes understanding of d/c instructions and denies any further needs at this time. 

## 2017-02-20 NOTE — ED Notes (Signed)
Dressing placed on Pt. Buttocks

## 2017-02-20 NOTE — ED Notes (Signed)
I&D being done on Pt.  Pt. Tolerating

## 2017-02-20 NOTE — ED Notes (Signed)
Family at bedside. 

## 2017-02-22 ENCOUNTER — Encounter (HOSPITAL_BASED_OUTPATIENT_CLINIC_OR_DEPARTMENT_OTHER): Payer: Self-pay | Admitting: Emergency Medicine

## 2017-02-22 ENCOUNTER — Emergency Department (HOSPITAL_BASED_OUTPATIENT_CLINIC_OR_DEPARTMENT_OTHER)
Admission: EM | Admit: 2017-02-22 | Discharge: 2017-02-22 | Disposition: A | Discharge status: Home / Self Care | Payer: 59 | Attending: Emergency Medicine | Admitting: Emergency Medicine

## 2017-02-22 DIAGNOSIS — J45909 Unspecified asthma, uncomplicated: Secondary | ICD-10-CM | POA: Insufficient documentation

## 2017-02-22 DIAGNOSIS — Z4801 Encounter for change or removal of surgical wound dressing: Secondary | ICD-10-CM | POA: Insufficient documentation

## 2017-02-22 DIAGNOSIS — Z5189 Encounter for other specified aftercare: Secondary | ICD-10-CM

## 2017-02-22 DIAGNOSIS — Z791 Long term (current) use of non-steroidal anti-inflammatories (NSAID): Secondary | ICD-10-CM | POA: Diagnosis not present

## 2017-02-22 DIAGNOSIS — Z09 Encounter for follow-up examination after completed treatment for conditions other than malignant neoplasm: Secondary | ICD-10-CM

## 2017-02-22 NOTE — ED Triage Notes (Signed)
Here for recheck of abscess to buttocks

## 2017-02-22 NOTE — Discharge Instructions (Signed)
Please read information below. Continue wound care as told at initial appointment. Patient should return to ED if purulent drainage develops, bleeding or fevers begin.

## 2017-02-22 NOTE — ED Provider Notes (Signed)
[facility]   CSN: [id number] Arrival date & time: 02/22/17  [id number]     History   Chief Complaint Chief Complaint  Patient presents with  . Wound Check    HPI John Johnson is a 21 y.o. male.  Patient presents for follow-up of wound check after I&D of hematoma on R buttock. States he changes the dressing everyday and is continuing to take the antibiotics as prescribed. Overall, the area has not been bothering him. He denies any pain, fever, systemic symptoms, pus, redness, irritation, abdominal pain or bowel changes.      Past Medical History:  Diagnosis Date  . Asthma    very mild -- rare albuterol use    Patient Active Problem List   Diagnosis Date Noted  . Visit for preventive health examination 07/05/2016  . Asthma, mild intermittent, well-controlled 07/09/2015  . Acne vulgaris 07/09/2015    Past Surgical History:  Procedure Laterality Date  . NO PAST SURGERIES         Home Medications    Prior to Admission medications   Medication Sig Start Date End Date Taking? Authorizing Provider  doxycycline (VIBRAMYCIN) 100 MG capsule Take 1 capsule (100 mg total) by mouth 2 (two) times daily. One po bid x 7 days 02/20/17   Fayrene HelperBowie Tran, PA-C  ibuprofen (ADVIL,MOTRIN) 800 MG tablet Take 1 tablet (800 mg total) by mouth 3 (three) times daily. 02/20/17   Fayrene HelperBowie Tran, PA-C  PROAIR HFA 108 (90 Base) MCG/ACT inhaler Inhale 1-2 puffs into the lungs every 6 (six) hours as needed for wheezing or shortness of breath. INHALE 1 TO 2 PUFFS BY MOUTH EVERY 4-6 HOURS AS NEEDED FOR WHEEZING. 07/05/16   Waldon MerlWilliam C Martin, PA-C  triamcinolone cream (KENALOG) 0.1 % Apply 1 application topically 2 (two) times daily. 09/15/16   Waldon MerlWilliam C Martin, PA-C    Family History Family History  Problem Relation Age of Onset  . Healthy Mother   . Healthy Father   . Healthy Brother     x 4  . Healthy Sister     x 2    Social History Social History  Substance Use Topics  .  Smoking status: Never Smoker  . Smokeless tobacco: Never Used  . Alcohol use No     Allergies   Patient has no known allergies.   Review of Systems Review of Systems  Constitutional: Negative for chills and fever.  HENT: Negative for sinus pain, sinus pressure and sneezing.   Eyes: Negative for visual disturbance.  Respiratory: Negative for cough, chest tightness and shortness of breath.   Cardiovascular: Negative for chest pain.  Gastrointestinal: Negative for abdominal pain, constipation, diarrhea, nausea and vomiting.  Genitourinary: Negative for dysuria.  Musculoskeletal: Negative for back pain.  Neurological: Negative for light-headedness.     Physical Exam Updated Vital Signs BP 118/72 (BP Location: Left Arm)   Pulse 70   Temp 98 F (36.7 C) (Oral)   Resp 18   Ht 5\' 8"  (1.727 m)   Wt 94.8 kg   SpO2 100%   BMI 31.78 kg/m   Physical Exam  Constitutional: He appears well-developed and well-nourished. No distress.  HENT:  Head: Normocephalic and atraumatic.  Nose: Nose normal.  Eyes: Conjunctivae and EOM are normal. Left eye exhibits no discharge. No scleral icterus.  Neck: Normal range of motion. Neck supple.  Cardiovascular: Normal rate, regular rhythm, normal heart sounds and intact distal pulses.  Exam reveals no gallop and no friction  rub.   No murmur heard. Pulmonary/Chest: Effort normal and breath sounds normal. No respiratory distress.  Abdominal: Soft. Bowel sounds are normal. There is no tenderness.  Musculoskeletal: Normal range of motion. He exhibits no edema.  Neurological: He is alert. He exhibits normal muscle tone. Coordination normal.  Skin: Skin is warm and dry. He is not diaphoretic.  There are 2 well healing wounds on R buttock where hematoma was drained. No signs of infection including, redness, purulent drainage, bleeding or warmth. No packing noted.  Psychiatric: He has a normal mood and affect.  Nursing note and vitals reviewed.    ED  Treatments / Results  Labs (all labs ordered are listed, but only abnormal results are displayed) Labs Reviewed - No data to display  EKG  EKG Interpretation None       Radiology No results found.  Procedures Procedures (including critical care time)  Medications Ordered in ED Medications - No data to display   Initial Impression / Assessment and Plan / ED Course  I have reviewed the triage vital signs and the nursing notes.  Pertinent labs & imaging results that were available during my care of the patient were reviewed by me and considered in my medical decision making (see chart for details).     Both sites of patient's I&D on R buttocks appears to be healing well. No signs of redness, purulent drainage or active bleeding. A new dressing was applied. Patient was reassured that wounds are healing well. States he is still taking antibiotic course. Since he has no other complaints at this time, and was instructed on return precautions. Patient voiced understanding of plan.  Final Clinical Impressions(s) / ED Diagnoses   Final diagnoses:  Follow-up examination  Abscess re-check    New Prescriptions Discharge Medication List as of 02/22/2017  9:49 AM       Dietrich Pates, PA 02/22/17 1017    Rolan Bucco, MD 02/22/17 1029

## 2017-10-24 ENCOUNTER — Other Ambulatory Visit: Payer: Self-pay

## 2017-10-24 MED ORDER — PROAIR HFA 108 (90 BASE) MCG/ACT IN AERS: 1.0000 | INHALATION_SPRAY | Freq: Four times a day (QID) | RESPIRATORY_TRACT | 1 refills | Status: DC | PRN

## 2017-12-05 ENCOUNTER — Ambulatory Visit (INDEPENDENT_AMBULATORY_CARE_PROVIDER_SITE_OTHER): Payer: 59 | Admitting: Family Medicine

## 2017-12-05 ENCOUNTER — Encounter: Payer: Self-pay | Admitting: Family Medicine

## 2017-12-05 VITALS — BP 110/80 | HR 73 | Temp 98.6°F | Ht 68.0 in | Wt 217.1 lb

## 2017-12-05 DIAGNOSIS — Z23 Encounter for immunization: Secondary | ICD-10-CM

## 2017-12-05 DIAGNOSIS — J4521 Mild intermittent asthma with (acute) exacerbation: Secondary | ICD-10-CM | POA: Diagnosis not present

## 2017-12-05 MED ORDER — PROAIR HFA 108 (90 BASE) MCG/ACT IN AERS: 1.0000 | INHALATION_SPRAY | Freq: Four times a day (QID) | RESPIRATORY_TRACT | 1 refills | Status: DC | PRN

## 2017-12-05 NOTE — Patient Instructions (Signed)
Continue to push fluids, practice good hand hygiene, and cover your mouth if you cough.  If you start having fevers, shaking or shortness of breath, seek immediate care.  Let us know if you need anything.  

## 2017-12-05 NOTE — Addendum Note (Signed)
Addended by: Scharlene GlossEWING, Maeryn Mcgath B on: 12/05/2017 10:56 AM   Modules accepted: Orders

## 2017-12-05 NOTE — Progress Notes (Signed)
Chief Complaint  Patient presents with  . Nasal Congestion    John Johnson here for URI complaints.  Duration: 2 days  Associated symptoms: sinus congestion, rhinorrhea, wheezing and cough Denies: sinus pain, itchy watery eyes, ear pain, ear drainage, sore throat, myalgia and fevers/rigors  +Hx of asthma, has not had PCV23, has not had inhaler. Mild intermittent. Illness is a trigger. Treatment to date: Mucinex Sick contacts: No  ROS:  Const: Denies fevers HEENT: As noted in HPI Lungs: No current SOB  Past Medical History:  Diagnosis Date  . Asthma    very mild -- rare albuterol use   Family History  Problem Relation Age of Onset  . Healthy Mother   . Healthy Father   . Healthy Brother        x 4  . Healthy Sister        x 2    BP 110/80 (BP Location: Left Arm, Patient Position: Sitting, Cuff Size: Large)   Pulse 73   Temp 98.6 F (37 C) (Oral)   Ht 5\' 8"  (1.727 m)   Wt 217 lb 2 oz (98.5 kg)   SpO2 97%   BMI 33.01 kg/m  General: Awake, alert, appears stated age HEENT: AT, [place], ears patent b/l and TM's neg, nares patent w/o discharge, pharynx pink and without exudates, MMM Neck: No masses or asymmetry Heart: RRR, no murmurs, no bruits Lungs: Diffuse wheezing, no accessory muscle use Psych: Age appropriate judgment and insight, normal mood and affect  Mild intermittent asthma with acute exacerbation - Plan: PROAIR HFA 108 (90 Base) MCG/ACT inhaler  Orders as above. PCV23 today. Continue to push fluids, practice good hand hygiene, cover mouth when coughing. F/u prn. If starting to experience fevers, shaking, or shortness of breath, seek immediate care. Pt voiced understanding and agreement to the plan.  Jilda Rocheicholas Paul [place], DO 12/05/17 10:35 AM

## 2017-12-05 NOTE — Progress Notes (Signed)
Pre visit review using our clinic review tool, if applicable. No additional management support is needed unless otherwise documented below in the visit note. 

## 2018-04-03 IMAGING — CT CT PELVIS W/ CM
2 of 3 series · 14 of 46 positions shown, 16 images · IV contrast (iopamidol)
Comparison: None.

CLINICAL DATA: Abscess at the junction of left buttock and
posterior thigh

EXAM:
CT PELVIS WITH CONTRAST
TECHNIQUE: Multidetector CT imaging of the pelvis was performed using the
standard protocol following the bolus administration of intravenous
contrast.
CONTRAST:  100mL 1AUC2H-WNN IOPAMIDOL (1AUC2H-WNN) INJECTION 61%

[Series 4: coronal st · coronal · 0.64mm/px · 3 of 126 slices shown]
[im 42/126  soft-tissue]
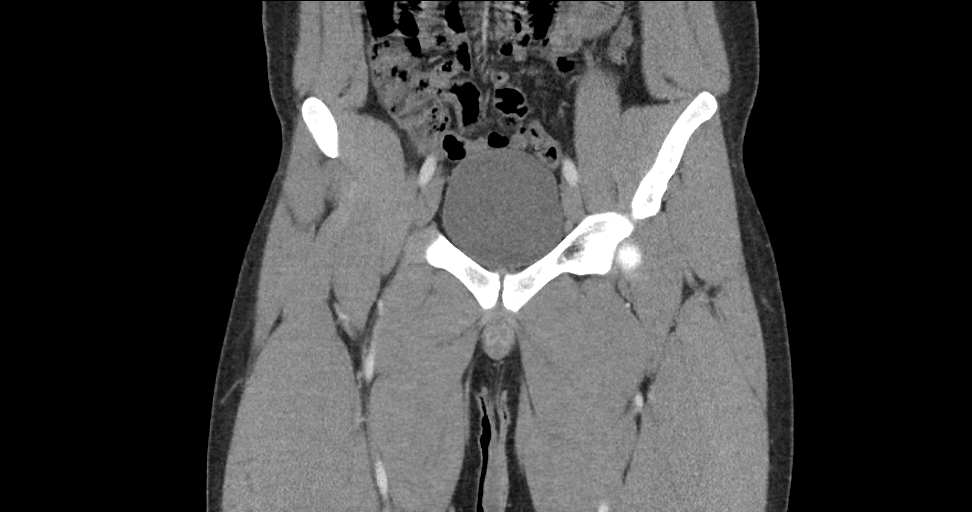
[im 56/126  soft-tissue]
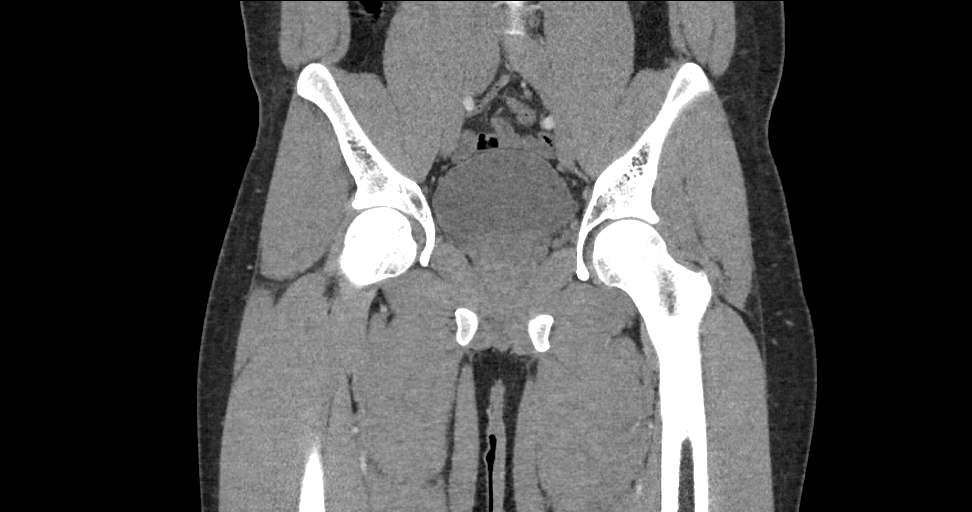
[im 70/126  soft-tissue]
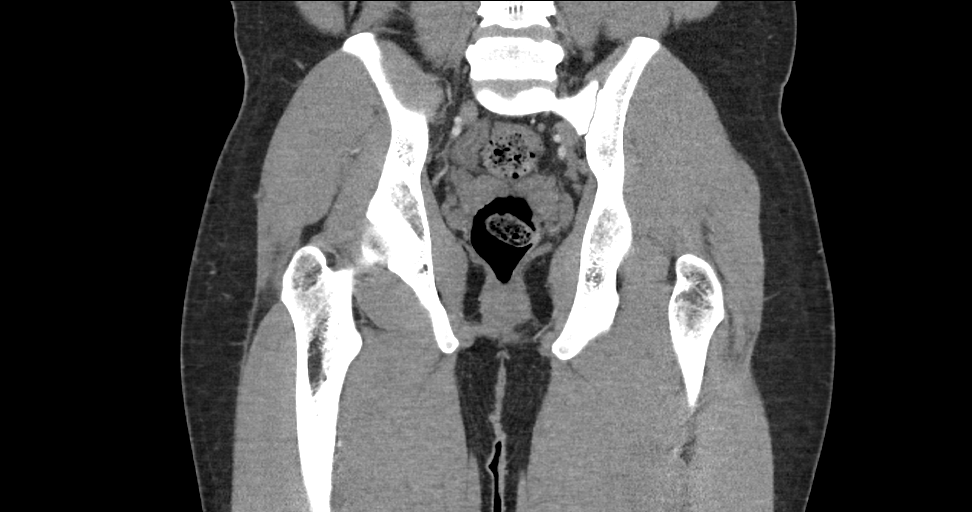

[Series 9: axial soft tissue · axial · 0.93mm/px · z∈[-318,-34]mm · 11 of 164 slices shown, 13 images]
[im 11/164  soft-tissue]
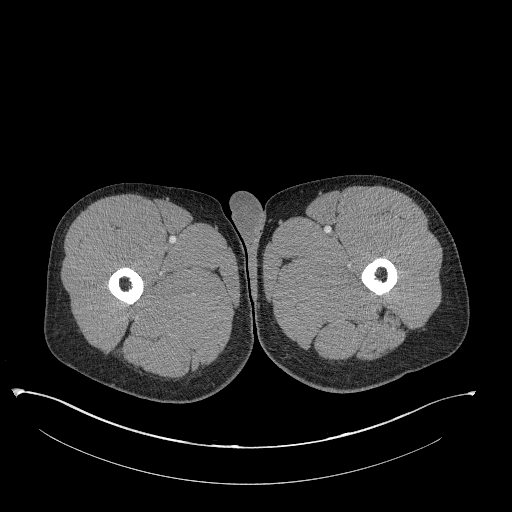
[im 11/164  bone]
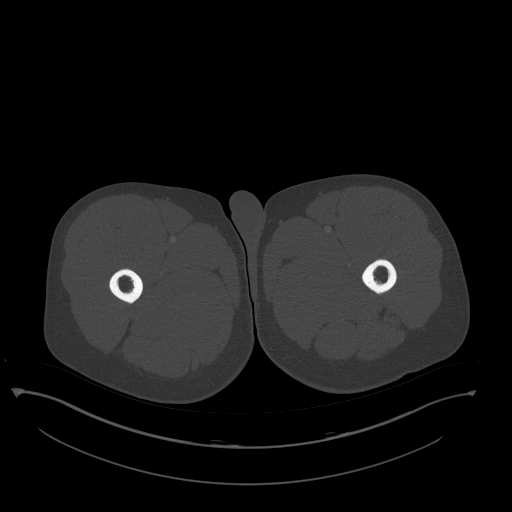
[im 27/164  soft-tissue]
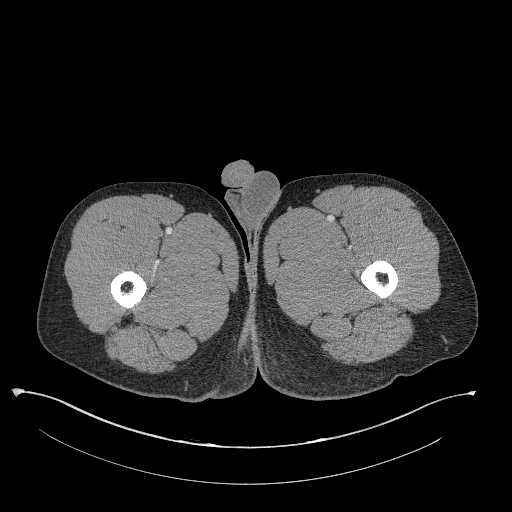
[im 37/164  soft-tissue]
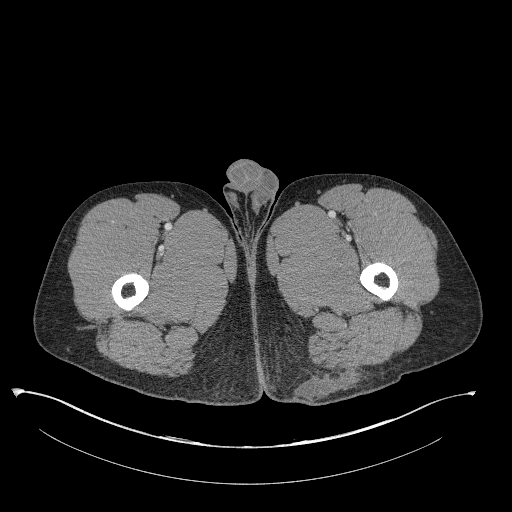
[im 53/164  soft-tissue]
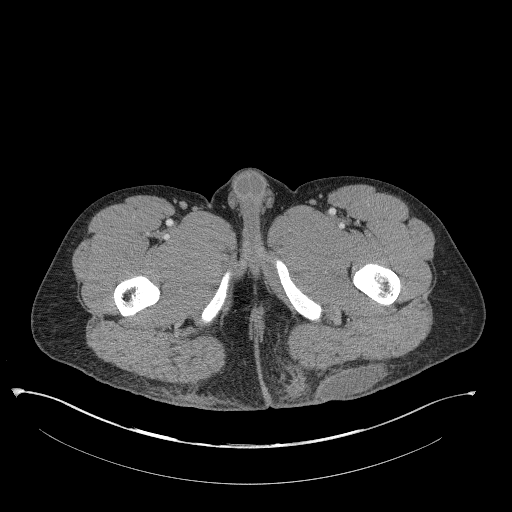
[im 69/164  soft-tissue]
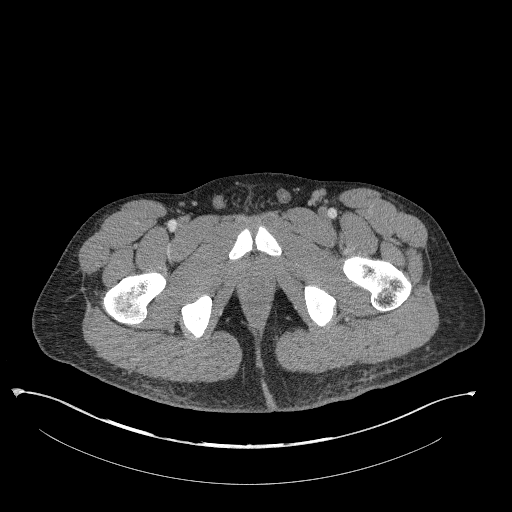
[im 85/164  soft-tissue]
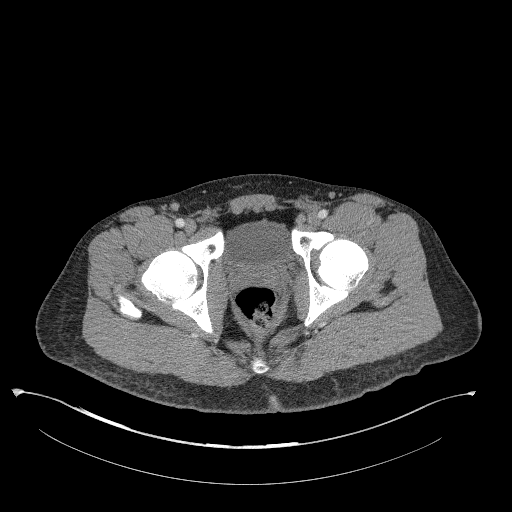
[im 95/164  soft-tissue]
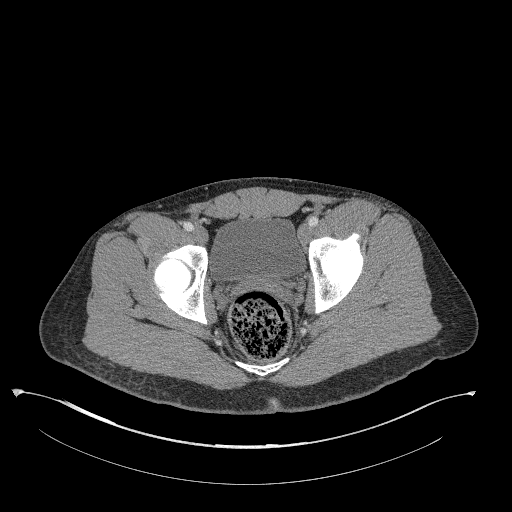
[im 111/164  soft-tissue]
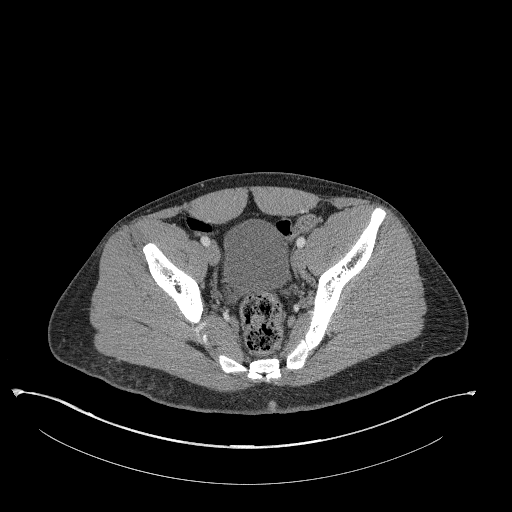
[im 127/164  soft-tissue]
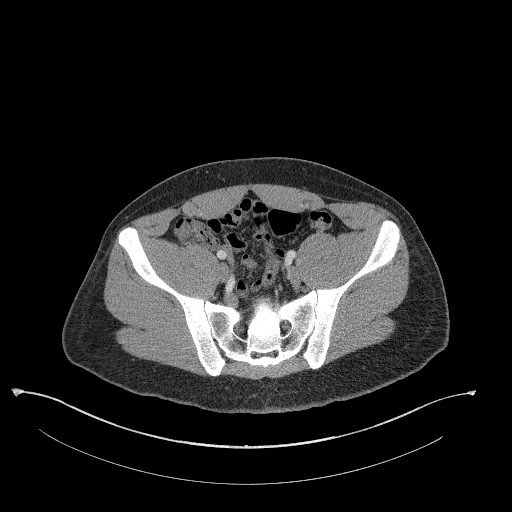
[im 127/164  bone]
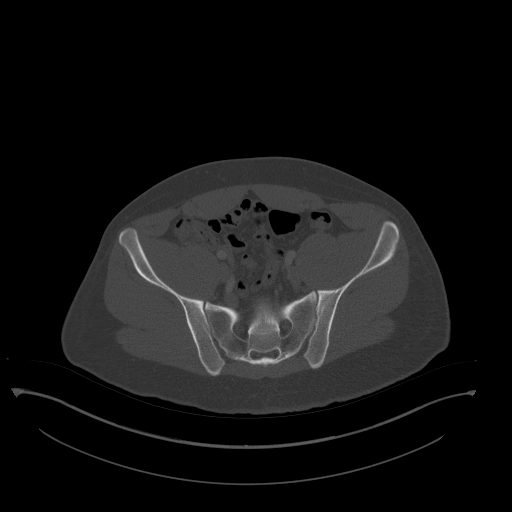
[im 137/164  soft-tissue]
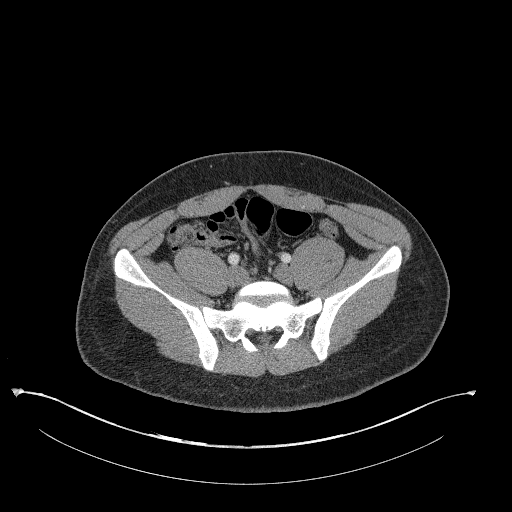
[im 153/164  soft-tissue]
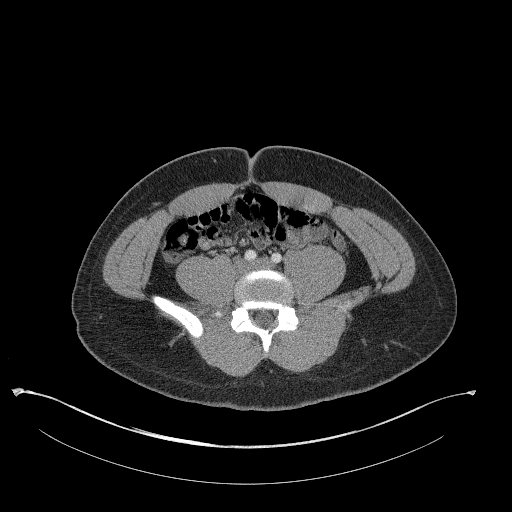

[14 of 46 positions shown; findings below may reference images not displayed]

FINDINGS: Urinary Tract: Bladder unremarkable. Distal ureters within normal
limits.

Bowel:  Visualized appendix normal.  No distal colon thickening.

Vascular/Lymphatic: No significant vascular abnormalities. Mild
inguinal nodes.

Reproductive:  No mass or other significant abnormality

Other: No significant free fluid in the pelvis. There is skin
thickening and infiltration of the subcutaneous fat overlying the
gluteal region left greater than right consistent with cellulitis.

Within it the subcutaneous fat of the left medial gluteal region are
2 adjacent fluid collections. The smaller more medial and superior
collection measures 4.8 cm oblique axial by 1 cm AP. The larger more
lateral and inferior collection measures 8.5 by 4.6 cm. There are 2
small low-attenuation nodules or small fluid collections just
superficial to the left medial gluteus muscle possibly representing
additional small infected fluid collections.

Musculoskeletal: There is no acute osseous abnormality.
IMPRESSION: 1. Two adjacent rim enhancing fluid collections within the
subcutaneous fat of the left gluteal area as described above; the
larger, dominant collection measures 8.5 x 4.6 cm ; these would be
consistent with soft tissue abscess. There are 2 smaller 1 cm
nodules or small fluid collections within the subcutaneous fat along
the medial edge of the left gluteus muscle.
2. Skin thickening and edema within the left greater than right
subcutaneous fat of the gluteal region, consistent with cellulitis.

## 2018-10-09 ENCOUNTER — Emergency Department (HOSPITAL_BASED_OUTPATIENT_CLINIC_OR_DEPARTMENT_OTHER)
Admission: EM | Admit: 2018-10-09 | Discharge: 2018-10-09 | Disposition: A | Discharge status: Home / Self Care | Payer: 59 | Attending: Emergency Medicine | Admitting: Emergency Medicine

## 2018-10-09 ENCOUNTER — Encounter (HOSPITAL_BASED_OUTPATIENT_CLINIC_OR_DEPARTMENT_OTHER): Payer: Self-pay | Admitting: Emergency Medicine

## 2018-10-09 ENCOUNTER — Other Ambulatory Visit: Payer: Self-pay

## 2018-10-09 DIAGNOSIS — Z79899 Other long term (current) drug therapy: Secondary | ICD-10-CM | POA: Diagnosis not present

## 2018-10-09 DIAGNOSIS — J45909 Unspecified asthma, uncomplicated: Secondary | ICD-10-CM | POA: Insufficient documentation

## 2018-10-09 DIAGNOSIS — R0602 Shortness of breath: Secondary | ICD-10-CM | POA: Diagnosis present

## 2018-10-09 DIAGNOSIS — J45901 Unspecified asthma with (acute) exacerbation: Secondary | ICD-10-CM

## 2018-10-09 MED ORDER — FLUTICASONE PROPIONATE HFA 44 MCG/ACT IN AERO
2.0000 | INHALATION_SPRAY | Freq: Two times a day (BID) | RESPIRATORY_TRACT | Status: DC
  Administered 2018-10-09: 2 via RESPIRATORY_TRACT
  Filled 2018-10-09: qty 10.6

## 2018-10-09 NOTE — ED Provider Notes (Signed)
[facility] [facility] EMERGENCY DEPARTMENT Provider Note   CSN: [id number] Arrival date & time: 10/09/18  0746     History   Chief Complaint Chief Complaint  Patient presents with  . Shortness of Breath    HPI John Johnson is a 22 y.o. male.  HPI Patient presents with shortness of breath.  Has history of asthma.  Usually pretty well controlled but has recently started working in [employer].  States that when he works he has more trouble breathing.  States he thinks it is the chemicals.  States he said he uses inhaler much more frequently.  States he would use his inhaler previously and have a few days where he would not have to use it again.  Now is using more than 1 time a day.  Occasional cough.  No sputum production.  Occasionally has some dull right-sided chest pain.  No fevers.  No swelling in his legs. Past Medical History:  Diagnosis Date  . Asthma    very mild -- rare albuterol use    Patient Active Problem List   Diagnosis Date Noted  . Visit for preventive health examination 07/05/2016  . Asthma, mild intermittent, well-controlled 07/09/2015  . Acne vulgaris 07/09/2015    Past Surgical History:  Procedure Laterality Date  . NO PAST SURGERIES          Home Medications    Prior to Admission medications   Medication Sig Start Date End Date Taking? Authorizing Provider  PROAIR HFA 108 [phone] Base) MCG/ACT inhaler Inhale 1-2 puffs into the lungs every 6 (six) hours as needed for wheezing or shortness of breath. 12/05/17   Sharlene Dory, DO    Family History Family History  Problem Relation Age of Onset  . Healthy Mother   . Healthy Father   . Healthy Brother        x 4  . Healthy Sister        x 2    Social History Social History   Tobacco Use  . Smoking status: Never Smoker  . Smokeless tobacco: Never Used  Substance Use Topics  . Alcohol use: No    Alcohol/week: 0.0 standard drinks  . Drug use: No     Allergies   Patient  has no known allergies.   Review of Systems Review of Systems  Constitutional: Negative for appetite change.  HENT: Negative for congestion.   Respiratory: Positive for cough and shortness of breath.   Cardiovascular: Negative for chest pain.  Gastrointestinal: Negative for abdominal pain.  Genitourinary: Negative for flank pain.  Musculoskeletal: Negative for back pain.  Skin: Negative for rash.  Neurological: Negative for weakness.  Psychiatric/Behavioral: Negative for confusion.     Physical Exam Updated Vital Signs BP (!) 137/99 (BP Location: Right Arm)   Pulse 61   Temp (!) 97.5 F (36.4 C) (Oral)   Resp 15   Ht 5\' 9"  (1.753 m)   Wt 97.5 kg   SpO2 100%   BMI 31.75 kg/m   Physical Exam  Constitutional: He appears well-developed and well-nourished.  HENT:  Head: Normocephalic.  Eyes: EOM are normal.  Neck: Neck supple.  Cardiovascular: Regular rhythm.  Pulmonary/Chest: Effort normal and breath sounds normal.  No chest tenderness  Abdominal: Soft.  Musculoskeletal:       Right lower leg: He exhibits no edema.       Left lower leg: He exhibits no edema.  Neurological: He is alert.  Skin: Skin is warm. Capillary refill  takes less than 2 seconds.     ED Treatments / Results  Labs (all labs ordered are listed, but only abnormal results are displayed) Labs Reviewed - No data to display  EKG None  Radiology No results found.  Procedures Procedures (including critical care time)  Medications Ordered in ED Medications  fluticasone (FLOVENT HFA) 44 MCG/ACT inhaler 2 puff (has no administration in time range)     Initial Impression / Assessment and Plan / ED Course  I have reviewed the triage vital signs and the nursing notes.  Pertinent labs & imaging results that were available during my care of the patient were reviewed by me and considered in my medical decision making (see chart for details).     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  May be related to exposure to chemicals.  Lungs are clear.  Has an inhaler at home.  Will try adding Flovent right now to see if it will help him.  May need more follow-up.  I do not think he needs more imaging at this time. PCP follow up.  Final Clinical Impressions(s) / ED Diagnoses   Final diagnoses:  Exacerbation of asthma, unspecified asthma severity, unspecified whether persistent    ED Discharge Orders    None       Benjiman Core, MD 10/09/18 [phone]

## 2018-10-09 NOTE — Discharge Instructions (Addendum)
Try the new inhaler and see if it helps control your symptoms while you are at work.  You may need further management and will need to follow-up with your primary care doctor.

## 2018-10-09 NOTE — ED Triage Notes (Signed)
Pt states he has been sob for one week.  Feels like his inhaler isn't working.  Pt states he has had to use it more often.  No cough, no fever, no cold symptoms.  Lungs clear.  No signs of respiratory distress, speaking in full sentences.

## 2018-10-18 ENCOUNTER — Encounter: Payer: Self-pay | Admitting: Emergency Medicine

## 2019-04-03 ENCOUNTER — Encounter: Payer: Self-pay | Admitting: Emergency Medicine

## 2019-04-22 ENCOUNTER — Telehealth: Payer: Self-pay | Admitting: Physician Assistant

## 2019-04-22 ENCOUNTER — Encounter: Payer: Self-pay | Admitting: Emergency Medicine

## 2019-04-22 NOTE — Telephone Encounter (Signed)
Has not been seen since 2018. No refills without appt.

## 2019-04-22 NOTE — Telephone Encounter (Signed)
Called patient but VM is full unable to leave a message. Patient is due for an appointment since last visit with PCP was 09/25/2016.

## 2019-04-22 NOTE — Telephone Encounter (Signed)
Copied from CRM [id number]. Topic: Quick Communication - Rx Refill/Question >> Apr 22, 2019  8:55 AM Baldo Daub L wrote: Medication: PROAIR HFA 108 (90 Base) MCG/ACT inhaler  Has the patient contacted their pharmacy? Yes - needs new script (Agent: If no, request that the patient contact the pharmacy for the refill.) (Agent: If yes, when and what did the pharmacy advise?)  Preferred Pharmacy (with phone number or street name): [id number] - [place], [place] - [address] AT [facility] [address] [phone] (Phone) [phone] (Fax)  Agent: Please be advised that RX refills may take up to 3 business days. We ask that you follow-up with your pharmacy.

## 2019-04-22 NOTE — Telephone Encounter (Signed)
My chart message sent to patient. Will call to advise that he will need an appointment

## 2019-04-25 NOTE — Telephone Encounter (Signed)
Spoke with patient about an appointment for refill of the Proair inhaler. Patient scheduled for Monday with [facility] for virtual visit

## 2019-04-28 ENCOUNTER — Encounter: Payer: 59 | Admitting: Physician Assistant

## 2019-04-28 NOTE — Progress Notes (Deleted)
I have discussed the procedure for the virtual visit with the patient who has given consent to proceed with assessment and treatment.   John Johnson, CMA     

## 2019-04-29 NOTE — Progress Notes (Signed)
This encounter was created in error - please disregard.

## 2020-10-14 ENCOUNTER — Encounter (HOSPITAL_COMMUNITY): Payer: Self-pay

## 2020-10-14 ENCOUNTER — Ambulatory Visit (HOSPITAL_COMMUNITY)
Admission: EM | Admit: 2020-10-14 | Discharge: 2020-10-14 | Disposition: A | Discharge status: Home / Self Care | Payer: 59

## 2020-10-14 ENCOUNTER — Other Ambulatory Visit: Payer: Self-pay

## 2020-10-14 DIAGNOSIS — L98491 Non-pressure chronic ulcer of skin of other sites limited to breakdown of skin: Secondary | ICD-10-CM | POA: Diagnosis not present

## 2020-10-14 NOTE — ED Triage Notes (Signed)
Pt in with c/o popped blister on palm of left hand that he noticed 2 weeks ago.    Wound is scabbed over  Denies bleeding or drainage from area

## 2020-10-14 NOTE — Discharge Instructions (Signed)
Use antibacterial ointment as needed on palm of hand. Keep clean with warm soapy water. Keep hands covered while working in [employer].

## 2020-10-14 NOTE — ED Provider Notes (Signed)
[facility]    CSN: [id number] Arrival date & time: 10/14/20  5400      History   Chief Complaint Chief Complaint  Patient presents with  . Blister    HPI John Johnson is a 24 y.o. male otherwise healthy presents with complaints of blistering on left hand.  Patient noticed area on palm of hand about 2 weeks ago.  Uncertain if he had a blister that popped.  He did not notice any blistering.  He denies any bleeding or drainage from area.  Patient works in [employer] and does heavy lifting.  No other suspicious lesions.    Past Medical History:  Diagnosis Date  . Asthma    very mild -- rare albuterol use    Patient Active Problem List   Diagnosis Date Noted  . Visit for preventive health examination 07/05/2016  . Asthma, mild intermittent, well-controlled 07/09/2015  . Acne vulgaris 07/09/2015    Past Surgical History:  Procedure Laterality Date  . NO PAST SURGERIES         Home Medications    Prior to Admission medications   Medication Sig Start Date End Date Taking? Authorizing Provider  PROAIR HFA 108 [phone] Base) MCG/ACT inhaler Inhale 1-2 puffs into the lungs every 6 (six) hours as needed for wheezing or shortness of breath. 12/05/17   Sharlene Dory, DO    Family History Family History  Problem Relation Age of Onset  . Healthy Mother   . Healthy Father   . Healthy Brother        x 4  . Healthy Sister        x 2    Social History Social History   Tobacco Use  . Smoking status: Never Smoker  . Smokeless tobacco: Never Used  Substance Use Topics  . Alcohol use: Yes    Alcohol/week: 0.0 standard drinks  . Drug use: Yes    Types: Marijuana     Allergies   Patient has no known allergies.   Review of Systems As stated in HPI otherwise negative   Physical Exam Triage Vital Signs ED Triage Vitals  Enc Vitals Group     BP 10/14/20 0921 129/80     Pulse Rate 10/14/20 0921 62     Resp 10/14/20 0921 20     Temp 10/14/20  0924 98.2 F (36.8 C)     Temp Source 10/14/20 0921 Oral     SpO2 10/14/20 0921 99 %     Weight --      Height --      Head Circumference --      Peak Flow --      Pain Score 10/14/20 0919 3     Pain Loc --      Pain Edu? --      Excl. in GC? --    No data found.  Updated Vital Signs BP 129/80 (BP Location: Right Arm)   Pulse 62   Temp 98.2 F (36.8 C) (Oral)   Resp 20   SpO2 99%   Visual Acuity Right Eye Distance:   Left Eye Distance:   Bilateral Distance:    Right Eye Near:   Left Eye Near:    Bilateral Near:     Physical Exam Constitutional:      General: He is not in acute distress.    Appearance: Normal appearance. He is not ill-appearing.  Skin:    General: Skin is warm and dry.  Comments: See picture below approximately 1-1/2 x 1 inch area of excoriation on palm of left hand scabbed over.  No drainage or bleeding.  Multiple calluses on upper part of hand chest below fingers  Neurological:     Mental Status: He is alert.          UC Treatments / Results  Labs (all labs ordered are listed, but only abnormal results are displayed) Labs Reviewed - No data to display  EKG   Radiology No results found.  Procedures Procedures (including critical care time)  Medications Ordered in UC Medications - No data to display  Initial Impression / Assessment and Plan / UC Course  I have reviewed the triage vital signs and the nursing notes.  Pertinent labs & imaging results that were available during my care of the patient were reviewed by me and considered in my medical decision making (see chart for details).  Callous -Patient with multiple calluses on palm of both hands likely due to heavy lifting at work -No evidence of bacterial infection or viral etiology -Wash with warm soapy water, Neosporin as needed.  Patient instructed to cover hands when working -Follow-up as needed  Reviewed expections re: course of current medical issues. Questions  answered. Outlined signs and symptoms indicating need for more acute intervention. Pt verbalized understanding. AVS given   Final Clinical Impressions(s) / UC Diagnoses   Final diagnoses:  Callous ulcer, limited to breakdown of skin [facility])     Discharge Instructions     Use antibacterial ointment as needed on palm of hand. Keep clean with warm soapy water. Keep hands covered while working in [employer].    ED Prescriptions    None     PDMP not reviewed this encounter.   Rolla Etienne, NP 10/14/20 1330

## 2021-03-28 ENCOUNTER — Telehealth: Payer: Self-pay

## 2021-03-28 NOTE — Telephone Encounter (Signed)
LVM asking patient to call back to get scheduled for a TOC. 

## 2021-10-29 ENCOUNTER — Other Ambulatory Visit: Payer: Self-pay

## 2021-10-29 ENCOUNTER — Encounter (HOSPITAL_BASED_OUTPATIENT_CLINIC_OR_DEPARTMENT_OTHER): Payer: Self-pay | Admitting: Emergency Medicine

## 2021-10-29 ENCOUNTER — Emergency Department (HOSPITAL_BASED_OUTPATIENT_CLINIC_OR_DEPARTMENT_OTHER)
Admission: EM | Admit: 2021-10-29 | Discharge: 2021-10-29 | Disposition: A | Discharge status: Home / Self Care | Payer: 59 | Attending: Emergency Medicine | Admitting: Emergency Medicine

## 2021-10-29 DIAGNOSIS — J4520 Mild intermittent asthma, uncomplicated: Secondary | ICD-10-CM | POA: Diagnosis not present

## 2021-10-29 DIAGNOSIS — L210 Seborrhea capitis: Secondary | ICD-10-CM

## 2021-10-29 DIAGNOSIS — J45909 Unspecified asthma, uncomplicated: Secondary | ICD-10-CM | POA: Insufficient documentation

## 2021-10-29 DIAGNOSIS — R21 Rash and other nonspecific skin eruption: Secondary | ICD-10-CM | POA: Insufficient documentation

## 2021-10-29 HISTORY — DX: Dermatitis, unspecified: L30.9

## 2021-10-29 MED ORDER — TRIAMCINOLONE ACETONIDE 0.1 % EX LOTN: 1.0000 "application " | TOPICAL_LOTION | Freq: Three times a day (TID) | CUTANEOUS | 1 refills | Status: DC

## 2021-10-29 NOTE — ED Provider Notes (Signed)
[facility] EMERGENCY DEPARTMENT Provider Note   CSN: [id number] Arrival date & time: 10/29/21  1327     History Chief Complaint  Patient presents with   Rash    John Johnson is a 25 y.o. male who presents emergency department chief complaint of rash.  Patient has a history of eczema.  Yesterday he noticed itching on his back and then had development of widespread rash over his trunk, upper arms and upper legs.  He states that it is intensely itchy.  He states that his eczema has never looked like this before.  He denies any recent changes in lotions or soaps, fevers, chills, other signs of viral illness, contacts with similar symptoms   Rash Location:  Shoulder/arm, torso and leg Severity:  Moderate Onset quality:  Sudden Duration:  2 days Timing:  Constant Progression:  Worsening Chronicity:  New Context: not animal contact, not chemical exposure, not eggs, not food, not hot tub use, not insect bite/sting, not medications, not new detergent/soap, not nuts, not plant contact, not pollen and not sun exposure   Relieved by:  Nothing Worsened by:  Nothing Ineffective treatments:  None tried Associated symptoms: no abdominal pain, no diarrhea, no fatigue, no fever, no headaches, no hoarse voice, no induration, no joint pain, no myalgias, no nausea, no periorbital edema, no shortness of breath, no sore throat, no throat swelling, no tongue swelling, not vomiting and not wheezing       Past Medical History:  Diagnosis Date   Asthma    very mild -- rare albuterol use   Eczema     Patient Active Problem List   Diagnosis Date Noted   Visit for preventive health examination 07/05/2016   Asthma, mild intermittent, well-controlled 07/09/2015   Acne vulgaris 07/09/2015    Past Surgical History:  Procedure Laterality Date   NO PAST SURGERIES         Family History  Problem Relation Age of Onset   Healthy Mother    Healthy Father    Healthy Brother        x 4    Healthy Sister        x 2    Social History   Tobacco Use   Smoking status: Never   Smokeless tobacco: Never  Substance Use Topics   Alcohol use: Yes    Alcohol/week: 0.0 standard drinks   Drug use: Yes    Types: Marijuana    Home Medications Prior to Admission medications   Medication Sig Start Date End Date Taking? Authorizing Provider  PROAIR HFA 108 [phone] Base) MCG/ACT inhaler Inhale 1-2 puffs into the lungs every 6 (six) hours as needed for wheezing or shortness of breath. 12/05/17   Sharlene Dory, DO    Allergies    Patient has no known allergies.  Review of Systems   Review of Systems  Constitutional:  Negative for fatigue and fever.  HENT:  Negative for hoarse voice and sore throat.   Respiratory:  Negative for shortness of breath and wheezing.   Gastrointestinal:  Negative for abdominal pain, diarrhea, nausea and vomiting.  Musculoskeletal:  Negative for arthralgias and myalgias.  Skin:  Positive for rash.  Neurological:  Negative for headaches.   Physical Exam Updated Vital Signs BP 121/76   Pulse 62   Temp 97.9 F (36.6 C) (Oral)   Resp 16   Ht 5\' 8"  (1.727 m)   Wt 99.8 kg   SpO2 97%   BMI 33.45 kg/m  Physical Exam Vitals and nursing note reviewed.  Constitutional:      General: He is not in acute distress.    Appearance: He is well-developed. He is not diaphoretic.  HENT:     Head: Normocephalic and atraumatic.  Eyes:     General: No scleral icterus.    Conjunctiva/sclera: Conjunctivae normal.  Cardiovascular:     Rate and Rhythm: Normal rate and regular rhythm.     Heart sounds: Normal heart sounds.  Pulmonary:     Effort: Pulmonary effort is normal. No respiratory distress.     Breath sounds: Normal breath sounds.  Abdominal:     Palpations: Abdomen is soft.     Tenderness: There is no abdominal tenderness.  Musculoskeletal:     Cervical back: Normal range of motion and neck supple.  Skin:    General: Skin is warm and  dry.     Comments: Multiple areas of small, well-circumscribed, circular patches with dry scale of varying sizes over the trunk, upper arms and upper thighs.  Neurological:     Mental Status: He is alert.  Psychiatric:        Behavior: Behavior normal.    ED Results / Procedures / Treatments   Labs (all labs ordered are listed, but only abnormal results are displayed) Labs Reviewed - No data to display  EKG None  Radiology No results found.  Procedures Procedures   Medications Ordered in ED Medications - No data to display  ED Course  I have reviewed the triage vital signs and the nursing notes.  Pertinent labs & imaging results that were available during my care of the patient were reviewed by me and considered in my medical decision making (see chart for details).    MDM Rules/Calculators/A&P Patient here with pruritic rash.  Most likely this represents pityriasis however nummular eczema also within the differential.  We will treat with topical steroid lotion, outpatient follow-up with dermatology.  No signs or symptoms of anaphylaxis, drug reaction, TENS, SJS or other emergent cause of rash.  Patient appears otherwise appropriate for discharge at this time Final Clinical Impression(s) / ED Diagnoses Final diagnoses:  None    Rx / DC Orders ED Discharge Orders     None        Arthor Captain, PA-C 10/29/21 1449    Alvira Monday, MD 10/30/21 0800

## 2021-10-29 NOTE — Discharge Instructions (Signed)
Return for any new or urgent concerns

## 2021-10-29 NOTE — ED Triage Notes (Signed)
Pt reports widespread rash since Wed; hx of eczema, but this is worse; rash noted to trunk and BUE, pt c/o itching

## 2021-11-15 ENCOUNTER — Emergency Department (HOSPITAL_BASED_OUTPATIENT_CLINIC_OR_DEPARTMENT_OTHER)
Admission: EM | Admit: 2021-11-15 | Discharge: 2021-11-15 | Disposition: A | Discharge status: Home / Self Care | Payer: 59 | Attending: Emergency Medicine | Admitting: Emergency Medicine

## 2021-11-15 ENCOUNTER — Encounter (HOSPITAL_BASED_OUTPATIENT_CLINIC_OR_DEPARTMENT_OTHER): Payer: Self-pay | Admitting: Emergency Medicine

## 2021-11-15 ENCOUNTER — Other Ambulatory Visit: Payer: Self-pay

## 2021-11-15 ENCOUNTER — Emergency Department (HOSPITAL_BASED_OUTPATIENT_CLINIC_OR_DEPARTMENT_OTHER): Payer: 59

## 2021-11-15 DIAGNOSIS — J45909 Unspecified asthma, uncomplicated: Secondary | ICD-10-CM | POA: Insufficient documentation

## 2021-11-15 DIAGNOSIS — Z20822 Contact with and (suspected) exposure to covid-19: Secondary | ICD-10-CM | POA: Diagnosis not present

## 2021-11-15 DIAGNOSIS — J3489 Other specified disorders of nose and nasal sinuses: Secondary | ICD-10-CM | POA: Insufficient documentation

## 2021-11-15 DIAGNOSIS — R059 Cough, unspecified: Secondary | ICD-10-CM | POA: Diagnosis present

## 2021-11-15 DIAGNOSIS — R519 Headache, unspecified: Secondary | ICD-10-CM | POA: Insufficient documentation

## 2021-11-15 DIAGNOSIS — J029 Acute pharyngitis, unspecified: Secondary | ICD-10-CM | POA: Insufficient documentation

## 2021-11-15 DIAGNOSIS — J111 Influenza due to unidentified influenza virus with other respiratory manifestations: Secondary | ICD-10-CM

## 2021-11-15 DIAGNOSIS — Z28310 Unvaccinated for covid-19: Secondary | ICD-10-CM | POA: Diagnosis not present

## 2021-11-15 LAB — RESP PANEL BY RT-PCR (FLU A&B, COVID) ARPGX2
Influenza A by PCR: NEGATIVE
Influenza B by PCR: NEGATIVE
SARS Coronavirus 2 by RT PCR: NEGATIVE

## 2021-11-15 LAB — GROUP A STREP BY PCR: Group A Strep by PCR: NOT DETECTED

## 2021-11-15 NOTE — ED Triage Notes (Signed)
Pt reports cough, congestion, fatigue x3 days.  Denies fever.  Ambulates without difficulty O2 sat 99%.  Hx asthma, no current medications

## 2021-11-15 NOTE — Discharge Instructions (Addendum)
You were seen in the emergency department for sore throat cough and generally not feeling well.  Your COVID and flu test were negative.  Your chest x-ray did not show any pneumonia.  Your strep test was negative.  This is likely a viral infection.  Please use Tylenol and ibuprofen for fever and pain.  Drink plenty of fluids.  Return to the emergency department if any worsening or concerning symptoms

## 2021-11-15 NOTE — ED Notes (Signed)
ED Provider at bedside. 

## 2021-11-15 NOTE — ED Provider Notes (Signed)
[facility] EMERGENCY DEPARTMENT Provider Note   CSN: [id number] Arrival date & time: 11/15/21  [id number]     History Chief Complaint  Patient presents with   Cough    congestion    John Johnson is a 25 y.o. male.  He is here with a complaint of 1 week of runny nose sore throat headache cough productive of some white sputum, fatigue, body aches.  No nausea vomiting diarrhea.  He is not COVID or flu vaccinated.  No sick contacts or recent travel.  The history is provided by the patient.  Influenza Presenting symptoms: cough, fatigue, headache, myalgias, rhinorrhea and sore throat   Presenting symptoms: no diarrhea, no nausea and no vomiting   Severity:  Moderate Onset quality:  Gradual Duration:  1 week Progression:  Worsening Chronicity:  New Relieved by:  None tried Worsened by:  Drinking and eating Ineffective treatments:  None tried Associated symptoms: nasal congestion   Associated symptoms: no chills, no ear pain, no mental status change and no syncope       Past Medical History:  Diagnosis Date   Asthma    very mild -- rare albuterol use   Eczema     Patient Active Problem List   Diagnosis Date Noted   Visit for preventive health examination 07/05/2016   Asthma, mild intermittent, well-controlled 07/09/2015   Acne vulgaris 07/09/2015    Past Surgical History:  Procedure Laterality Date   NO PAST SURGERIES         Family History  Problem Relation Age of Onset   Healthy Mother    Healthy Father    Healthy Brother        x 4   Healthy Sister        x 2    Social History   Tobacco Use   Smoking status: Never   Smokeless tobacco: Never  Substance Use Topics   Alcohol use: Yes    Alcohol/week: 0.0 standard drinks   Drug use: Yes    Types: Marijuana    Home Medications Prior to Admission medications   Medication Sig Start Date End Date Taking? Authorizing Provider  PROAIR HFA 108 [phone] Base) MCG/ACT inhaler Inhale 1-2 puffs into the  lungs every 6 (six) hours as needed for wheezing or shortness of breath. 12/05/17   Shelda Pal, DO  triamcinolone lotion (KENALOG) 0.1 % Apply 1 application topically 3 (three) times daily. 10/29/21   Margarita Mail, PA-C    Allergies    Patient has no known allergies.  Review of Systems   Review of Systems  Constitutional:  Positive for fatigue. Negative for chills.  HENT:  Positive for congestion, rhinorrhea and sore throat. Negative for ear pain.   Eyes:  Negative for visual disturbance.  Respiratory:  Positive for cough.   Cardiovascular:  Negative for chest pain.  Gastrointestinal:  Negative for diarrhea, nausea and vomiting.  Genitourinary:  Negative for enuresis.  Musculoskeletal:  Positive for myalgias.  Skin:  Negative for rash.  Neurological:  Positive for headaches.   Physical Exam Updated Vital Signs BP 115/70 (BP Location: Right Arm)   Pulse 74   Temp 99 F (37.2 C) (Oral)   Resp 16   Ht 5\' 8"  (1.727 m)   Wt 99.8 kg   SpO2 99%   BMI 33.45 kg/m   Physical Exam Vitals and nursing note reviewed.  Constitutional:      General: He is not in acute distress.    Appearance: Normal  appearance. He is well-developed.  HENT:     Head: Normocephalic and atraumatic.     Right Ear: Tympanic membrane normal.     Left Ear: Tympanic membrane normal.     Nose: Congestion present.     Mouth/Throat:     Mouth: Mucous membranes are moist.     Pharynx: Posterior oropharyngeal erythema present. No oropharyngeal exudate.  Eyes:     Conjunctiva/sclera: Conjunctivae normal.  Cardiovascular:     Rate and Rhythm: Normal rate and regular rhythm.     Heart sounds: No murmur heard. Pulmonary:     Effort: Pulmonary effort is normal. No respiratory distress.     Breath sounds: Normal breath sounds.  Abdominal:     Palpations: Abdomen is soft.     Tenderness: There is no abdominal tenderness. There is no guarding or rebound.  Musculoskeletal:        General: No  swelling. Normal range of motion.     Cervical back: Neck supple.  Skin:    General: Skin is warm and dry.     Capillary Refill: Capillary refill takes less than 2 seconds.  Neurological:     General: No focal deficit present.     Mental Status: He is alert.    ED Results / Procedures / Treatments   Labs (all labs ordered are listed, but only abnormal results are displayed) Labs Reviewed  GROUP A STREP BY PCR  RESP PANEL BY RT-PCR (FLU A&B, COVID) [id number]    EKG None  Radiology DG Chest Port 1 View  Result Date: 11/15/2021 CLINICAL DATA:  Cough, chest congestion and fatigue over the last 3 days EXAM: PORTABLE CHEST 1 VIEW COMPARISON:  10/09/2010 FINDINGS: Lordotic positioning. Allowing for that, the heart and mediastinal shadows are normal in the lungs are clear. Normal vascularity. No effusions. No abnormal bone finding. IMPRESSION: Lordotic positioning.  No active disease. Electronically Signed   By: Paulina Fusi M.D.   On: 11/15/2021 09:09    Procedures Procedures   Medications Ordered in ED Medications - No data to display  ED Course  I have reviewed the triage vital signs and the nursing notes.  Pertinent labs & imaging results that were available during my care of the patient were reviewed by me and considered in my medical decision making (see chart for details).  Clinical Course as of 11/15/21 1751  Tue Nov 15, 2021  0912 Chest x-ray does not show any acute infiltrates.  Awaiting radiology reading. [MB]    Clinical Course User Index [MB] Terrilee Files, MD   MDM Rules/Calculators/A&P                          John Johnson was evaluated in Emergency Department on 11/15/2021 for the symptoms described in the history of present illness. He was evaluated in the context of the global COVID-19 pandemic, which necessitated consideration that the patient might be at risk for infection with the SARS-CoV-2 virus that causes COVID-19. Institutional protocols and algorithms  that pertain to the evaluation of patients at risk for COVID-19 are in a state of rapid change based on information released by regulatory bodies including the CDC and federal and state organizations. These policies and algorithms were followed during the patient's care in the ED.  Differential includes COVID, flu, pneumonia, bronchitis, viral syndrome.  His COVID and flu are negative, strep test is negative, chest x-ray does not show any acute infiltrates.  Recommended symptomatic treatment  with antipyretics fluids and rest.  Return instructions discussed Final Clinical Impression(s) / ED Diagnoses Final diagnoses:  Influenza-like illness    Rx / DC Orders ED Discharge Orders     None        Hayden Rasmussen, MD 11/15/21 1751

## 2022-03-27 ENCOUNTER — Other Ambulatory Visit: Payer: Self-pay

## 2022-03-27 ENCOUNTER — Emergency Department (HOSPITAL_BASED_OUTPATIENT_CLINIC_OR_DEPARTMENT_OTHER)
Admission: EM | Admit: 2022-03-27 | Discharge: 2022-03-27 | Disposition: A | Discharge status: Home / Self Care | Payer: 59 | Attending: Emergency Medicine | Admitting: Emergency Medicine

## 2022-03-27 ENCOUNTER — Encounter (HOSPITAL_BASED_OUTPATIENT_CLINIC_OR_DEPARTMENT_OTHER): Payer: Self-pay

## 2022-03-27 DIAGNOSIS — R197 Diarrhea, unspecified: Secondary | ICD-10-CM | POA: Diagnosis not present

## 2022-03-27 DIAGNOSIS — R112 Nausea with vomiting, unspecified: Secondary | ICD-10-CM | POA: Insufficient documentation

## 2022-03-27 LAB — CBC WITH DIFFERENTIAL/PLATELET
Abs Immature Granulocytes: 0.03 10*3/uL (ref 0.00–0.07)
Basophils Absolute: 0 10*3/uL (ref 0.0–0.1)
Basophils Relative: 0 %
Eosinophils Absolute: 0.2 10*3/uL (ref 0.0–0.5)
Eosinophils Relative: 2 %
HCT: 47.2 % (ref 39.0–52.0)
Hemoglobin: 16.5 g/dL (ref 13.0–17.0)
Immature Granulocytes: 0 %
Lymphocytes Relative: 8 %
Lymphs Abs: 0.8 10*3/uL (ref 0.7–4.0)
MCH: 29.4 pg (ref 26.0–34.0)
MCHC: 35 g/dL (ref 30.0–36.0)
MCV: 84.1 fL (ref 80.0–100.0)
Monocytes Absolute: 0.7 10*3/uL (ref 0.1–1.0)
Monocytes Relative: 7 %
Neutro Abs: 8.2 10*3/uL — ABNORMAL HIGH (ref 1.7–7.7)
Neutrophils Relative %: 83 %
Platelets: 306 10*3/uL (ref 150–400)
RBC: 5.61 MIL/uL (ref 4.22–5.81)
RDW: 12.9 % (ref 11.5–15.5)
WBC: 9.9 10*3/uL (ref 4.0–10.5)
nRBC: 0 % (ref 0.0–0.2)

## 2022-03-27 LAB — COMPREHENSIVE METABOLIC PANEL
ALT: 21 U/L (ref 0–44)
AST: 18 U/L (ref 15–41)
Albumin: 4.4 g/dL (ref 3.5–5.0)
Alkaline Phosphatase: 56 U/L (ref 38–126)
Anion gap: 7 (ref 5–15)
BUN: 13 mg/dL (ref 6–20)
CO2: 25 mmol/L (ref 22–32)
Calcium: 9.2 mg/dL (ref 8.9–10.3)
Chloride: 105 mmol/L (ref 98–111)
Creatinine, Ser: 1.18 mg/dL (ref 0.61–1.24)
GFR, Estimated: >60 mL/min (ref >60)
Glucose, Bld: 101 mg/dL — ABNORMAL HIGH (ref 70–99)
Potassium: 3.8 mmol/L (ref 3.5–5.1)
Sodium: 137 mmol/L (ref 135–145)
Total Bilirubin: 0.8 mg/dL (ref 0.3–1.2)
Total Protein: 7.5 g/dL (ref 6.5–8.1)

## 2022-03-27 LAB — LIPASE, BLOOD: Lipase: 30 U/L (ref 11–51)

## 2022-03-27 MED ORDER — SODIUM CHLORIDE 0.9 % IV BOLUS
1000.0000 mL | Freq: Once | INTRAVENOUS | Status: AC
  Administered 2022-03-27: 1000 mL via INTRAVENOUS

## 2022-03-27 MED ORDER — ONDANSETRON HCL 4 MG PO TABS: 4.0000 mg | ORAL_TABLET | Freq: Four times a day (QID) | ORAL | 0 refills | Status: DC

## 2022-03-27 MED ORDER — ONDANSETRON HCL 4 MG/2ML IJ SOLN
4.0000 mg | Freq: Once | INTRAMUSCULAR | Status: AC
  Administered 2022-03-27: 4 mg via INTRAVENOUS
  Filled 2022-03-27: qty 2

## 2022-03-27 NOTE — ED Provider Notes (Signed)
?[facility] EMERGENCY DEPARTMENT ?Provider Note ? ? ?CSN: [id number] ?Arrival date & time: 03/27/22  [id number] ? ?  ? ?History ? ?Chief Complaint  ?Patient presents with  ? Emesis  ? ? ?John Johnson is a 26 y.o. male. ? ?The history is provided by the patient.  ?Emesis ?Severity:  Moderate ?Duration:  8 hours ?Timing:  Intermittent ?Able to tolerate:  Liquids ?Progression:  Unchanged ?Chronicity:  New ?Relieved by:  Nothing ?Worsened by:  Nothing ?Associated symptoms: diarrhea   ?Associated symptoms: no abdominal pain, no arthralgias, no chills, no cough, no fever, no headaches, no myalgias, no sore throat and no URI   ?Risk factors: suspect food intake   ? ?  ? ?Home Medications ?Prior to Admission medications   ?Medication Sig Start Date End Date Taking? Authorizing Provider  ?ondansetron (ZOFRAN) 4 MG tablet Take 1 tablet (4 mg total) by mouth every 6 (six) hours. 03/27/22  Yes Lynnetta Tom, DO  ?PROAIR HFA 108 (90 Base) MCG/ACT inhaler Inhale 1-2 puffs into the lungs every 6 (six) hours as needed for wheezing or shortness of breath. 12/05/17   Shelda Pal, DO  ?triamcinolone lotion (KENALOG) 0.1 % Apply 1 application topically 3 (three) times daily. 10/29/21   Margarita Mail, PA-C  ?   ? ?Allergies    ?Patient has no known allergies.   ? ?Review of Systems   ?Review of Systems  ?Constitutional:  Negative for chills and fever.  ?HENT:  Negative for sore throat.   ?Respiratory:  Negative for cough.   ?Gastrointestinal:  Positive for diarrhea and vomiting. Negative for abdominal pain.  ?Musculoskeletal:  Negative for arthralgias and myalgias.  ?Neurological:  Negative for headaches.  ? ?Physical Exam ?Updated Vital Signs ?BP 110/72 (BP Location: Right Arm)   Pulse 70   Temp 98.1 ?F (36.7 ?C) (Oral)   Resp 18   Ht 5\' 8"  (1.727 m)   Wt 99.8 kg   SpO2 99%   BMI 33.45 kg/m?  ?Physical Exam ?Vitals and nursing note reviewed.  ?Constitutional:   ?   General: He is not in acute distress. ?    Appearance: He is well-developed. He is not ill-appearing.  ?HENT:  ?   Head: Normocephalic and atraumatic.  ?   Nose: Nose normal.  ?   Mouth/Throat:  ?   Mouth: Mucous membranes are moist.  ?Eyes:  ?   Extraocular Movements: Extraocular movements intact.  ?   Conjunctiva/sclera: Conjunctivae normal.  ?   Pupils: Pupils are equal, round, and reactive to light.  ?Cardiovascular:  ?   Rate and Rhythm: Normal rate and regular rhythm.  ?   Pulses: Normal pulses.  ?   Heart sounds: No murmur heard. ?Pulmonary:  ?   Effort: Pulmonary effort is normal. No respiratory distress.  ?   Breath sounds: Normal breath sounds.  ?Abdominal:  ?   General: Abdomen is flat.  ?   Palpations: Abdomen is soft.  ?   Tenderness: There is no abdominal tenderness. There is no guarding or rebound.  ?Musculoskeletal:     ?   General: No swelling.  ?   Cervical back: Neck supple.  ?Skin: ?   General: Skin is warm and dry.  ?   Capillary Refill: Capillary refill takes less than 2 seconds.  ?Neurological:  ?   General: No focal deficit present.  ?   Mental Status: He is alert.  ?Psychiatric:     ?   Mood and Affect: Mood  normal.  ? ? ?ED Results / Procedures / Treatments   ?Labs ?(all labs ordered are listed, but only abnormal results are displayed) ?Labs Reviewed  ?CBC WITH DIFFERENTIAL/PLATELET - Abnormal; Notable for the following components:  ?    Result Value  ? Neutro Abs 8.2 (*)   ? All other components within normal limits  ?COMPREHENSIVE METABOLIC PANEL - Abnormal; Notable for the following components:  ? Glucose, Bld 101 (*)   ? All other components within normal limits  ?LIPASE, BLOOD  ? ? ?EKG ?None ? ?Radiology ?No results found. ? ?Procedures ?Procedures  ? ? ?Medications Ordered in ED ?Medications  ?sodium chloride 0.9 % bolus 1,000 mL (1,000 mLs Intravenous New Bag/Given 03/27/22 0821)  ?ondansetron [facility]) injection 4 mg (4 mg Intravenous Given 03/27/22 0821)  ? ? ?ED Course/ Medical Decision Making/ A&P ?  ?                         ?Medical Decision Making ?Amount and/or Complexity of Data Reviewed ?Labs: ordered. ? ?Risk ?Prescription drug management. ? ? ?Ratana Flood is here with nausea, vomiting, diarrhea.  Possibly suspicious food intake.  Normal vitals.  No fever.  Differential diagnosis is likely viral gastroenteritis/foodborne illness.  Less likely pancreatitis or cholecystitis.  No abdominal pain.  No tenderness on exam of his abdomen.  Overall well-appearing.  Reassuring vitals.  Will check CBC, CMP, lipase, give IV fluids and IV Zofran. ? ?Per my review and interpretation of labs there is no significant anemia, electrolyte abnormality, kidney injury.  Gallbladder and liver enzymes and lipase all within normal limits.  Doubt cholecystitis or pancreatitis.  Suspect viral GI illness.  Will prescribe Zofran.  Understands return precautions.  No need for CT as no concern for appendicitis or other acute intra-abdominal process at this time.  Discharge. ? ?This chart was dictated using voice recognition software.  Despite best efforts to proofread,  errors can occur which can change the documentation meaning.  ? ? ? ? ? ? ? ?Final Clinical Impression(s) / ED Diagnoses ?Final diagnoses:  ?Nausea vomiting and diarrhea  ? ? ?Rx / DC Orders ?ED Discharge Orders   ? ?      Ordered  ?  ondansetron (ZOFRAN) 4 MG tablet  Every 6 hours       ? 03/27/22 0912  ? ?  ?  ? ?  ? ? ?  ?Lennice Sites, DO ?03/27/22 [id number] ? ?

## 2022-03-27 NOTE — Discharge Instructions (Addendum)
Lab work today is normal.  Recommend that the symptoms will resolve on their own.  Have provided you with Zofran prescription for nausea and vomiting.  Please follow-up with primary care doctor.  Number provided. ?

## 2022-03-27 NOTE — ED Triage Notes (Signed)
C/o vomiting/diarrhea since 0400 today. Generalized abdominal pain. ?

## 2022-07-20 ENCOUNTER — Emergency Department (HOSPITAL_BASED_OUTPATIENT_CLINIC_OR_DEPARTMENT_OTHER)
Admission: EM | Admit: 2022-07-20 | Discharge: 2022-07-20 | Disposition: A | Discharge status: Home / Self Care | Payer: 59 | Attending: Emergency Medicine | Admitting: Emergency Medicine

## 2022-07-20 ENCOUNTER — Other Ambulatory Visit: Payer: Self-pay

## 2022-07-20 ENCOUNTER — Encounter (HOSPITAL_BASED_OUTPATIENT_CLINIC_OR_DEPARTMENT_OTHER): Payer: Self-pay

## 2022-07-20 DIAGNOSIS — M5432 Sciatica, left side: Secondary | ICD-10-CM

## 2022-07-20 DIAGNOSIS — M5442 Lumbago with sciatica, left side: Secondary | ICD-10-CM | POA: Insufficient documentation

## 2022-07-20 MED ORDER — KETOROLAC TROMETHAMINE 30 MG/ML IJ SOLN
30.0000 mg | Freq: Once | INTRAMUSCULAR | Status: AC
Start: 2022-07-20 — End: 2022-07-20
  Administered 2022-07-20: 30 mg via INTRAMUSCULAR
  Filled 2022-07-20: qty 1

## 2022-07-20 MED ORDER — METHOCARBAMOL 500 MG PO TABS: 500.0000 mg | ORAL_TABLET | Freq: Two times a day (BID) | ORAL | 0 refills | Status: AC

## 2022-07-20 NOTE — ED Triage Notes (Signed)
Pain to left lumbar back down left leg. Denies known injury

## 2022-07-20 NOTE — ED Notes (Signed)
Patient pain is in his left lower back and radiates to left leg. .Denies any injuries . Leg has full movement . Alert x4

## 2022-07-20 NOTE — ED Provider Notes (Signed)
[facility] EMERGENCY DEPARTMENT Provider Note   CSN: [id number] Arrival date & time: 07/20/22  1630     History PMH: Asthma Chief Complaint  Patient presents with   Back Pain   Leg Pain    John Johnson is a 26 y.o. male. Presents with left lower back pain that started yesterday when he woke up from sleep.  He says that the pain radiates down the backside of his leg to his calf.  This is worse with movements.  States that prior to this night he was doing a lot of heavy lifting at work.  He has had similar symptoms in the past, but they have usually gotten better on their own.  He has not tried any medications at home.  He denies any saddle anesthesia, bowel or bladder incontinence or retention, fevers, history of IV drug use, history of malignancy or diabetes, no suppression, numbness or weakness in lower extremities, gait abnormalities.   Back Pain Associated symptoms: leg pain   Leg Pain Associated symptoms: back pain        Home Medications Prior to Admission medications   Medication Sig Start Date End Date Taking? Authorizing Provider  methocarbamol (ROBAXIN) 500 MG tablet Take 1 tablet (500 mg total) by mouth 2 (two) times daily for 7 days. 07/20/22 07/27/22 Yes Breslin Burklow, Finis Bud, PA-C  ondansetron (ZOFRAN) 4 MG tablet Take 1 tablet (4 mg total) by mouth every 6 (six) hours. 03/27/22   Curatolo, Adam, DO  PROAIR HFA 108 (90 Base) MCG/ACT inhaler Inhale 1-2 puffs into the lungs every 6 (six) hours as needed for wheezing or shortness of breath. 12/05/17   Sharlene Dory, DO  triamcinolone lotion (KENALOG) 0.1 % Apply 1 application topically 3 (three) times daily. 10/29/21   Arthor Captain, PA-C      Allergies    Patient has no known allergies.    Review of Systems   Review of Systems  Musculoskeletal:  Positive for back pain.  All other systems reviewed and are negative.   Physical Exam Updated Vital Signs BP (!) 119/93 (BP Location: Left Arm)    Pulse 66   Temp 98.1 F (36.7 C) (Oral)   Resp 16   SpO2 98%  Physical Exam Vitals and nursing note reviewed.  Constitutional:      General: He is not in acute distress.    Appearance: Normal appearance. He is well-developed. He is not ill-appearing, toxic-appearing or diaphoretic.  HENT:     Head: Normocephalic and atraumatic.     Nose: No nasal deformity.     Mouth/Throat:     Lips: Pink. No lesions.  Eyes:     General: Gaze aligned appropriately. No scleral icterus.       Right eye: No discharge.        Left eye: No discharge.     Conjunctiva/sclera: Conjunctivae normal.     Right eye: Right conjunctiva is not injected. No exudate or hemorrhage.    Left eye: Left conjunctiva is not injected. No exudate or hemorrhage. Pulmonary:     Effort: Pulmonary effort is normal. No respiratory distress.  Musculoskeletal:     Comments: Midline lumbar tenderness to palpation or any step-offs noted.  He has reproducible left lower back muscular tenderness.  Positive left straight leg test. DP/PT pulses 2+ and equal bilaterally No leg edema Sensation grossly intact on anterior thighs, dorsum of foot and lateral foot Strength of knee flexion and extension is 5/5 Plantar and dorsiflexion of ankle  5/5 Achilles and patellar reflexes present and equal Gait normal   Skin:    General: Skin is warm and dry.  Neurological:     Mental Status: He is alert and oriented to person, place, and time.  Psychiatric:        Mood and Affect: Mood normal.        Speech: Speech normal.        Behavior: Behavior normal. Behavior is cooperative.     ED Results / Procedures / Treatments   Labs (all labs ordered are listed, but only abnormal results are displayed) Labs Reviewed - No data to display  EKG None  Radiology No results found.  Procedures Procedures    Medications Ordered in ED Medications  ketorolac (TORADOL) 30 MG/ML injection 30 mg (has no administration in time range)    ED  Course/ Medical Decision Making/ A&P                           Medical Decision Making Risk Prescription drug management.   Patient presents the ED with left lower back pain and radicular pains down his left leg that are consistent with sciatica.  He has no red flag back pain symptoms on history or exam.  He has no neurological deficits on exam noting normal reflexes, strength, and sensation.  MRI not indicated at this time.  Will give dose of Toradol here in the ED.  Recommend anti-inflammatories, and supportive treatments at home.  Return precautions given.  Final Clinical Impression(s) / ED Diagnoses Final diagnoses:  Sciatica of left side    Rx / DC Orders ED Discharge Orders          Ordered    methocarbamol (ROBAXIN) 500 MG tablet  2 times daily        07/20/22 1657              Ameliah Baskins, Finis Bud, PA-C 07/20/22 1701    Glynn Octave, MD 07/20/22 [phone]

## 2022-07-20 NOTE — Discharge Instructions (Addendum)
Please treat pain with over the counter ibuprofen as needed. You were given a prescription for Robaxin which is a muscle relaxer.  You should not drive, work, consume alcohol, or operate machinery while taking this medication as it can make you very drowsy.  You can take this as needed for pain. Usually I recommend taking this before sleep  If you experience bowel or bladder incontinence, fevers, or worsening symptoms, please return to the Emergency Department.

## 2022-12-28 IMAGING — DX DG CHEST 1V PORT
1 series · 1 of 1 positions shown · non-contrast
Comparison: 10/09/2010

CLINICAL DATA: Cough, chest congestion and fatigue over the last 3
days

EXAM:
PORTABLE CHEST 1 VIEW

[chest ap]
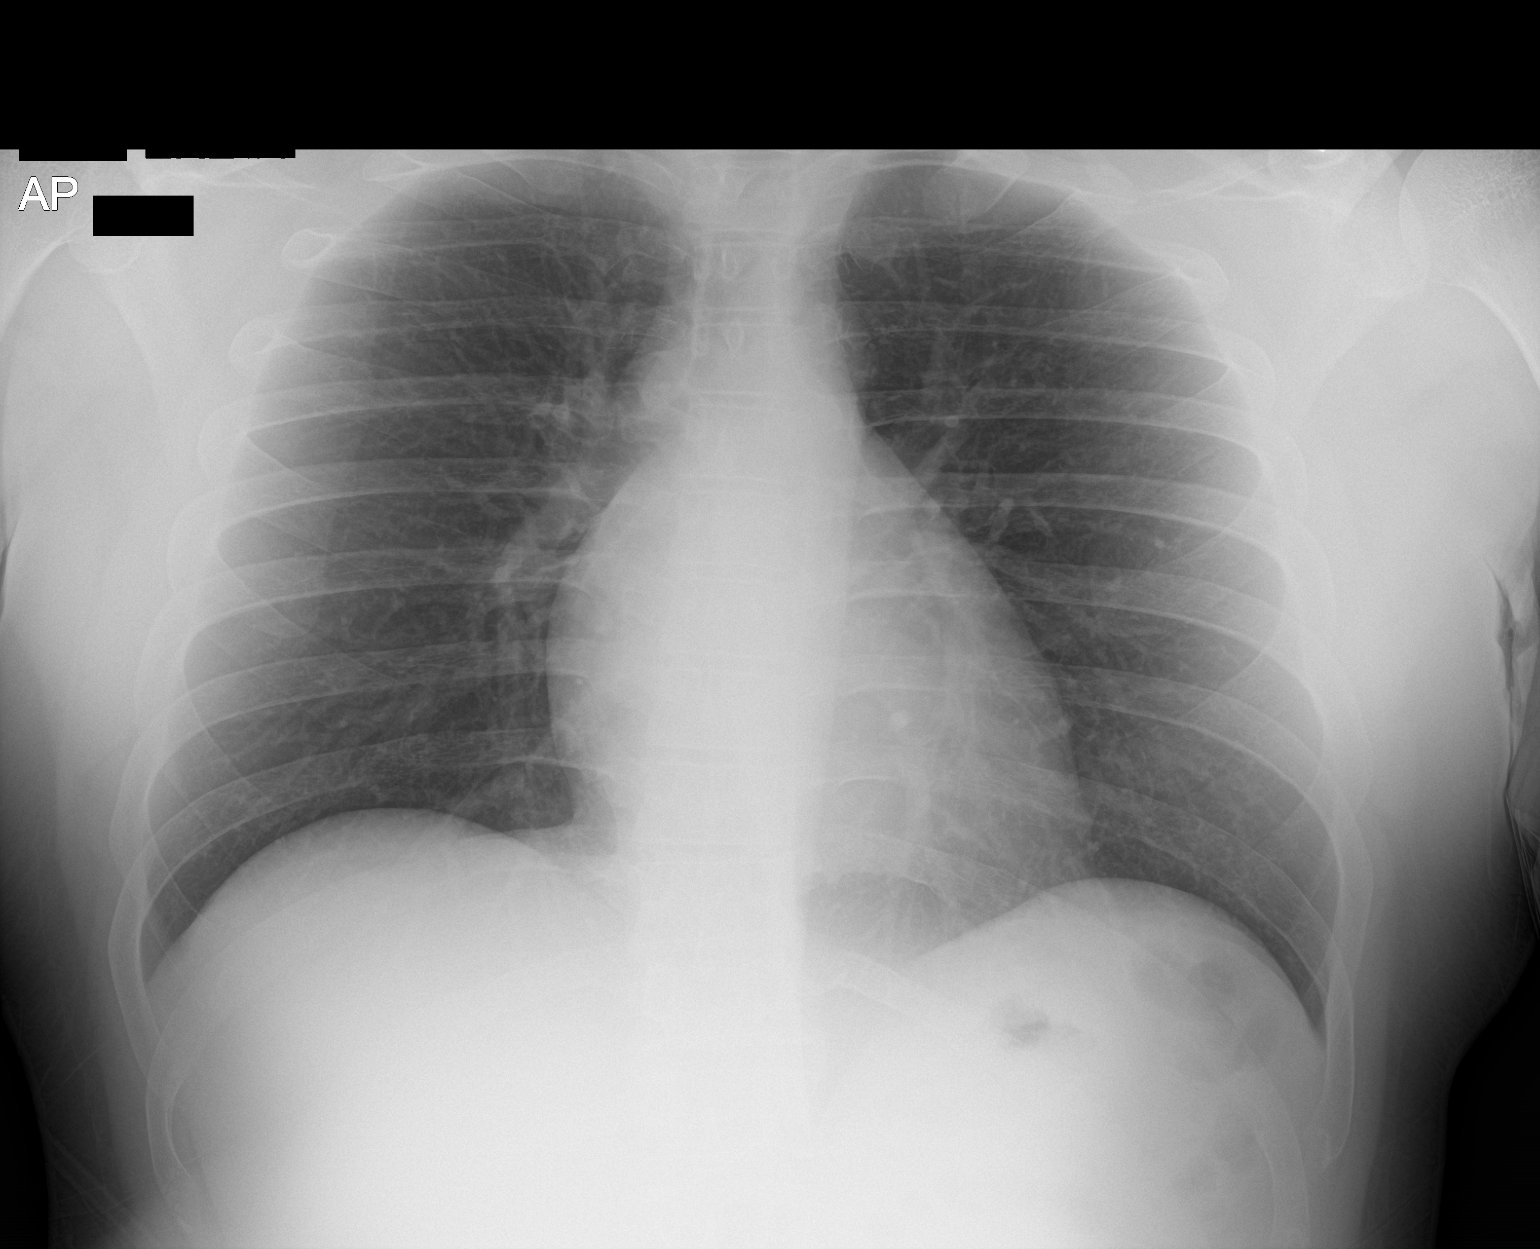

[1 of 1 positions shown; findings below may reference images not displayed]

FINDINGS: Lordotic positioning. Allowing for that, the heart and mediastinal
shadows are normal in the lungs are clear. Normal vascularity. No
effusions. No abnormal bone finding.
IMPRESSION: Lordotic positioning.  No active disease.

## 2023-02-05 ENCOUNTER — Other Ambulatory Visit: Payer: Self-pay

## 2023-02-05 ENCOUNTER — Encounter (HOSPITAL_BASED_OUTPATIENT_CLINIC_OR_DEPARTMENT_OTHER): Payer: Self-pay | Admitting: Urology

## 2023-02-05 ENCOUNTER — Emergency Department (HOSPITAL_BASED_OUTPATIENT_CLINIC_OR_DEPARTMENT_OTHER)
Admission: EM | Admit: 2023-02-05 | Discharge: 2023-02-05 | Disposition: A | Discharge status: Home / Self Care | Payer: Self-pay | Attending: Emergency Medicine | Admitting: Emergency Medicine

## 2023-02-05 DIAGNOSIS — X500XXA Overexertion from strenuous movement or load, initial encounter: Secondary | ICD-10-CM | POA: Insufficient documentation

## 2023-02-05 DIAGNOSIS — M5442 Lumbago with sciatica, left side: Secondary | ICD-10-CM | POA: Insufficient documentation

## 2023-02-05 MED ORDER — KETOROLAC TROMETHAMINE 60 MG/2ML IM SOLN
60.0000 mg | Freq: Once | INTRAMUSCULAR | Status: AC
  Administered 2023-02-05: 60 mg via INTRAMUSCULAR
  Filled 2023-02-05: qty 2

## 2023-02-05 MED ORDER — CYCLOBENZAPRINE HCL 5 MG PO TABS
5.0000 mg | ORAL_TABLET | Freq: Once | ORAL | Status: AC
Start: 2023-02-05 — End: 2023-02-05
  Administered 2023-02-05: 5 mg via ORAL
  Filled 2023-02-05: qty 1

## 2023-02-05 MED ORDER — CYCLOBENZAPRINE HCL 10 MG PO TABS: 10.0000 mg | ORAL_TABLET | Freq: Two times a day (BID) | ORAL | 0 refills | Status: DC | PRN

## 2023-02-05 MED ORDER — HYDROCODONE-ACETAMINOPHEN 5-325 MG PO TABS: 1.0000 | ORAL_TABLET | ORAL | 0 refills | Status: DC | PRN

## 2023-02-05 MED ORDER — LIDOCAINE 5 % EX PTCH
1.0000 | MEDICATED_PATCH | CUTANEOUS | Status: DC
  Administered 2023-02-05: 1 via TRANSDERMAL
  Filled 2023-02-05: qty 1

## 2023-02-05 MED ORDER — LIDOCAINE 5 % EX PTCH: 1.0000 | MEDICATED_PATCH | CUTANEOUS | 0 refills | Status: DC

## 2023-02-05 NOTE — Discharge Instructions (Addendum)
Recommend Tylenol and ibuprofen for pain control, lidocaine patch, Flexeril has been prescribed as well.  Follow-up with a spine specialist if you do not experience any improvement.  Return to the emergency department for any new lower extremity weakness, urinary or fecal incontinence, new falls or trauma, back pain and fever, saddle anesthesia or numbness around your groin

## 2023-02-05 NOTE — ED Provider Notes (Signed)
John Johnson   CSN: [id number] Arrival date & time: 02/05/23  [id number]     History  Chief Complaint  Patient presents with   Back Pain    John Johnson is a 27 y.o. male.   Back Pain    27 year old male presenting to the emergency department for low back pain.  The patient states that he does a lot of heavy lifting for his job.  He states that around 10 days ago he did notice some back pain which is worsened since then.  He denies any falls or trauma.  Denies any IV drug abuse.  Denies any fevers or chills.  Denies any numbness or weakness, urinary or fecal incontinence.  He states that he has had pain from his lumbar spine which radiates down his left leg.  He became unbearable this morning and thus he presented to the emergency department for further evaluation.  Home Medications Prior to Admission medications   Medication Sig Start Date End Date Taking? Authorizing Provider  cyclobenzaprine (FLEXERIL) 10 MG tablet Take 1 tablet (10 mg total) by mouth 2 (two) times daily as needed for muscle spasms. 02/05/23  Yes Regan Lemming, MD  HYDROcodone-acetaminophen (NORCO/VICODIN) 5-325 MG tablet Take 1-2 tablets by mouth every 4 (four) hours as needed. 02/05/23  Yes Regan Lemming, MD  lidocaine (LIDODERM) 5 % Place 1 patch onto the skin daily. Remove & Discard patch within 12 hours or as directed by MD 02/05/23  Yes Regan Lemming, MD  ondansetron (ZOFRAN) 4 MG tablet Take 1 tablet (4 mg total) by mouth every 6 (six) hours. 03/27/22   Curatolo, Adam, DO  PROAIR HFA 108 (90 Base) MCG/ACT inhaler Inhale 1-2 puffs into the lungs every 6 (six) hours as needed for wheezing or shortness of breath. 12/05/17   Shelda Pal, DO  triamcinolone lotion (KENALOG) 0.1 % Apply 1 application topically 3 (three) times daily. 10/29/21   Margarita Mail, PA-C      Allergies    Patient has no known allergies.    Review of Systems   Review of  Systems  Musculoskeletal:  Positive for back pain.  All other systems reviewed and are negative.   Physical Exam Updated Vital Signs BP (!) 131/91 (BP Location: Left Arm)   Pulse (!) 57   Temp 98.4 F (36.9 C) (Oral)   Resp 17   Ht '5\' 8"'$  (1.727 m)   Wt 97.5 kg   SpO2 99%   BMI 32.69 kg/m  Physical Exam Vitals and nursing Johnson reviewed.  Constitutional:      General: He is not in acute distress. HENT:     Head: Normocephalic and atraumatic.  Eyes:     Conjunctiva/sclera: Conjunctivae normal.     Pupils: Pupils are equal, round, and reactive to light.  Cardiovascular:     Rate and Rhythm: Normal rate and regular rhythm.  Pulmonary:     Effort: Pulmonary effort is normal. No respiratory distress.  Abdominal:     General: There is no distension.     Tenderness: There is no guarding.  Musculoskeletal:        General: No deformity or signs of injury.     Cervical back: Neck supple.     Comments: No midline tenderness to palpation of the cervical, thoracic or lumbar spine.  Straight leg raise test positive on the left  Skin:    Findings: No lesion or rash.  Neurological:  General: No focal deficit present.     Mental Status: He is alert. Mental status is at baseline.     Comments: 5 out of 5 strength in the bilateral lower extremities with intact sensation to light touch     ED Results / Procedures / Treatments   Labs (all labs ordered are listed, but only abnormal results are displayed) Labs Reviewed - No data to display  EKG None  Radiology No results found.  Procedures Procedures    Medications Ordered in ED Medications  lidocaine (LIDODERM) 5 % 1 patch (1 patch Transdermal Patch Applied 02/05/23 1049)  cyclobenzaprine (FLEXERIL) tablet 5 mg (5 mg Oral Given 02/05/23 1047)  ketorolac (TORADOL) injection 60 mg (60 mg Intramuscular Given 02/05/23 1047)    ED Course/ Medical Decision Making/ A&P                             Medical Decision  Making Risk Prescription drug management.    27 year old male presenting to the emergency department for low back pain.  The patient states that he does a lot of heavy lifting for his job.  He states that around 10 days ago he did notice some back pain which is worsened since then.  He denies any falls or trauma.  Denies any IV drug abuse.  Denies any fevers or chills.  Denies any numbness or weakness, urinary or fecal incontinence.  He states that he has had pain from his lumbar spine which radiates down his left leg.  He became unbearable this morning and thus he presented to the emergency department for further evaluation.  The patient is able to ambulate and is hemodynamically stable. There are no red flag symptoms.  The patient is able to ambulate specifically, he denies: -Being on an anticoagulant or blood thinner -Using IV drugs -Having a history of AAA -Having saddle anesthesia -Having urinary or fecal incontinence -Having any recent falls or trauma   Differential Diagnoses: I do not think that John Johnson is experiencing cauda equina syndrome, abdominal aortic aneurysm, epidural abscess, aortic dissection, spinal hematoma, nephrolithiasis, spinal metastasis, discitis, or an acute fracture.   While in the ED, I provided the patient with: Medications  lidocaine (LIDODERM) 5 % 1 patch (1 patch Transdermal Patch Applied 02/05/23 1049)  cyclobenzaprine (FLEXERIL) tablet 5 mg (5 mg Oral Given 02/05/23 1047)  ketorolac (TORADOL) injection 60 mg (60 mg Intramuscular Given 02/05/23 1047)    On reassessment, the patient was ambulatory, neurologically intact, pain improved. This presentation is most consistent with low back strain versus low back pain with radiculopathy potentially from degenerative disc disease.  Given the patient's reassuring presentation, I believe that he is safe for discharge.  I provided ED return precautions, specifically for the symptoms which are most concerning  (e.g., saddle anesthesia, urinary or bowel incontinence or retention, changing or worsening pain), which would necessitate immediate return.  I encouraged the patient to followup with their PCP.  Final Clinical Impression(s) / ED Diagnoses Final diagnoses:  Acute left-sided low back pain with left-sided sciatica    Rx / DC Orders ED Discharge Orders          Ordered    HYDROcodone-acetaminophen (NORCO/VICODIN) 5-325 MG tablet  Every 4 hours PRN        02/05/23 1104    cyclobenzaprine (FLEXERIL) 10 MG tablet  2 times daily PRN        02/05/23 1104    lidocaine (  LIDODERM) 5 %  Every 24 hours        02/05/23 1104              Regan Lemming, MD 02/05/23 1106

## 2023-02-05 NOTE — ED Notes (Signed)
Discharge instructions reviewed with patient. Patient verbalizes understanding, no further questions at this time. Medications/prescriptions and follow up information provided. No acute distress noted at time of departure.  

## 2023-02-05 NOTE — ED Triage Notes (Signed)
Pt states lower back pain x 10 days worse when sitting for long time  States heavy lifting at work, no known injury  Pain radiating down left leg as well

## 2023-10-19 ENCOUNTER — Other Ambulatory Visit: Payer: Self-pay

## 2023-10-19 ENCOUNTER — Emergency Department (HOSPITAL_BASED_OUTPATIENT_CLINIC_OR_DEPARTMENT_OTHER)
Admission: EM | Admit: 2023-10-19 | Discharge: 2023-10-19 | Disposition: A | Discharge status: Home / Self Care | Payer: Self-pay | Attending: Emergency Medicine | Admitting: Emergency Medicine

## 2023-10-19 ENCOUNTER — Encounter (HOSPITAL_BASED_OUTPATIENT_CLINIC_OR_DEPARTMENT_OTHER): Payer: Self-pay

## 2023-10-19 DIAGNOSIS — J45909 Unspecified asthma, uncomplicated: Secondary | ICD-10-CM | POA: Insufficient documentation

## 2023-10-19 DIAGNOSIS — Z20822 Contact with and (suspected) exposure to covid-19: Secondary | ICD-10-CM | POA: Insufficient documentation

## 2023-10-19 DIAGNOSIS — J069 Acute upper respiratory infection, unspecified: Secondary | ICD-10-CM | POA: Insufficient documentation

## 2023-10-19 LAB — SARS CORONAVIRUS 2 BY RT PCR: SARS Coronavirus 2 by RT PCR: NEGATIVE

## 2023-10-19 MED ORDER — ALBUTEROL SULFATE HFA 108 (90 BASE) MCG/ACT IN AERS: 2.0000 | INHALATION_SPRAY | RESPIRATORY_TRACT | 0 refills | Status: DC | PRN

## 2023-10-19 MED ORDER — BENZONATATE 100 MG PO CAPS: 100.0000 mg | ORAL_CAPSULE | Freq: Three times a day (TID) | ORAL | 0 refills | Status: DC

## 2023-10-19 NOTE — ED Notes (Signed)
Reviewed discharge instructions and medications with pt. States understanding. Work note provided

## 2023-10-19 NOTE — ED Provider Notes (Signed)
John Johnson   CSN: [id number] Arrival date & time: 10/19/23  9629     History  Chief Complaint  Patient presents with   Cough    John Johnson is a 27 y.o. male.  Patient with history of asthma presents with approximately 48 hours of nasal congestion, body aches, headache, cough.  Patient denies fevers or shaking chills.  Cough is nonproductive.  He has been taking NyQuil with some improvement.  States that he did not feel well enough to go to work yesterday or today.  No known sick contacts.  No vomiting or diarrhea.  No other treatments prior to arrival.  States that he has a history of asthma but does not currently have an inhaler.  Denies any wheezing or respiratory distress but does have some tightness in the chest at night.       Home Medications Prior to Admission medications   Medication Sig Start Date End Date Taking? Authorizing Provider  albuterol (VENTOLIN HFA) 108 (90 Base) MCG/ACT inhaler Inhale 2 puffs into the lungs every 4 (four) hours as needed for wheezing or shortness of breath. 10/19/23  Yes Renne Crigler, PA-C  benzonatate (TESSALON) 100 MG capsule Take 1 capsule (100 mg total) by mouth every 8 (eight) hours. 10/19/23  Yes Renne Crigler, PA-C      Allergies    Patient has no known allergies.    Review of Systems   Review of Systems  Physical Exam Updated Vital Signs BP (!) 122/93   Pulse 74   Temp 98.7 F (37.1 C) (Oral)   Resp 18   Ht 5\' 8"  (1.727 m)   Wt 99.8 kg   SpO2 96%   BMI 33.45 kg/m  Physical Exam Vitals and nursing Johnson reviewed.  Constitutional:      Appearance: He is well-developed.  HENT:     Head: Normocephalic and atraumatic.     Jaw: No trismus.     Right Ear: Tympanic membrane, ear canal and external ear normal.     Left Ear: Tympanic membrane, ear canal and external ear normal.     Nose: Congestion present. No mucosal edema or rhinorrhea.     Right Turbinates:  Swollen.     Left Turbinates: Swollen.     Mouth/Throat:     Mouth: Mucous membranes are not dry.     Pharynx: Uvula midline. No oropharyngeal exudate, posterior oropharyngeal erythema or uvula swelling.     Tonsils: No tonsillar abscesses.  Eyes:     General:        Right eye: No discharge.        Left eye: No discharge.     Conjunctiva/sclera: Conjunctivae normal.  Cardiovascular:     Rate and Rhythm: Normal rate and regular rhythm.     Heart sounds: Normal heart sounds.  Pulmonary:     Effort: Pulmonary effort is normal. No respiratory distress.     Breath sounds: Normal breath sounds. No wheezing or rales.  Abdominal:     Palpations: Abdomen is soft.     Tenderness: There is no abdominal tenderness.  Musculoskeletal:     Cervical back: Normal range of motion and neck supple.  Skin:    General: Skin is warm and dry.  Neurological:     Mental Status: He is alert.     ED Results / Procedures / Treatments   Labs (all labs ordered are listed, but only abnormal results are displayed) Labs Reviewed  SARS CORONAVIRUS 2 BY RT PCR    EKG None  Radiology No results found.  Procedures Procedures    Medications Ordered in ED Medications - No data to display  ED Course/ Medical Decision Making/ A&P    Patient seen and examined. History obtained directly from patient. Work-up including labs, imaging, EKG ordered in triage, if performed, were reviewed.    Labs/EKG: COVID testing was negative  Imaging: None ordered  Medications/Fluids: None ordered  Most recent vital signs reviewed and are as follows: BP (!) 122/93   Pulse 74   Temp 98.7 F (37.1 C) (Oral)   Resp 18   Ht 5\' 8"  (1.727 m)   Wt 99.8 kg   SpO2 96%   BMI 33.45 kg/m   Initial impression: Viral upper respiratory infection, COVID-negative, no red flags  Home treatment plan: OTC meds, albuterol as needed, prescription for Tessalon written  Return instructions discussed with patient: High fevers,  worsening shortness of breath or increased work of breathing, new or worsening symptoms  Follow-up instructions discussed with patient: Follow-up with PCP in 3 to 5 days for recheck if not improving                                Medical Decision Making Risk Prescription drug management.   Patient with symptoms consistent with a viral syndrome. Vitals are stable, no fever. No signs of dehydration. Lung exam normal, no signs of pneumonia. Supportive therapy indicated with return if symptoms worsen.  COVID neg.          Final Clinical Impression(s) / ED Diagnoses Final diagnoses:  Viral URI with cough    Rx / DC Orders ED Discharge Orders          Ordered    benzonatate (TESSALON) 100 MG capsule  Every 8 hours        10/19/23 1017    albuterol (VENTOLIN HFA) 108 (90 Base) MCG/ACT inhaler  Every 4 hours PRN        10/19/23 1017              Renne Crigler, PA-C 10/19/23 1020    Lonell Grandchild, MD 10/19/23 1058

## 2023-10-19 NOTE — ED Triage Notes (Signed)
The patient is having cough, body aches, headache for three days. No fever.

## 2023-10-19 NOTE — Discharge Instructions (Addendum)
Please read and follow all provided instructions.  Your diagnoses today include:  1. Viral URI with cough    You appear to have an upper respiratory infection (URI). An upper respiratory tract infection, or cold, is a viral infection of the air passages leading to the lungs. It should improve gradually after 5-7 days. You may have a lingering cough that lasts for 2- 4 weeks after the infection.  Tests performed today include: Vital signs. See below for your results today.  COVID testing was negative  Medications prescribed:  Albuterol inhaler - medication that opens up your airway  Use inhaler as follows: 1-2 puffs with spacer every 4 hours as needed for wheezing, cough, or shortness of breath.   Tessalon Perles - cough suppressant medication  Take any prescribed medications only as directed. Treatment for your infection is aimed at treating the symptoms. There are no medications, such as antibiotics, that will cure your infection.   Home care instructions:  You can take Tylenol and/or Ibuprofen as directed on the packaging for fever reduction and pain relief.    For cough: honey 1/2 to 1 teaspoon (you can dilute the honey in water or another fluid).  You can also use guaifenesin and dextromethorphan for cough. You can use a humidifier for chest congestion and cough.  If you don't have a humidifier, you can sit in the bathroom with the hot shower running.      For sore throat: try warm salt water gargles, cepacol lozenges, throat spray, warm tea or water with lemon/honey, popsicles or ice, or OTC cold relief medicine for throat discomfort.    For congestion: take a daily anti-histamine like Zyrtec, Claritin, and a oral decongestant, such as pseudoephedrine.  You can also use Flonase 1-2 sprays in each nostril daily.    It is important to stay hydrated: drink plenty of fluids (water, gatorade/powerade/pedialyte, juices, or teas) to keep your throat moisturized and help further relieve  irritation/discomfort.   Your illness is contagious and can be spread to others, especially during the first 3 or 4 days. It cannot be cured by antibiotics or other medicines. Take basic precautions such as washing your hands often, covering your mouth when you cough or sneeze, and avoiding public places where you could spread your illness to others.   Please continue drinking plenty of fluids.  Use over-the-counter medicines as needed as directed on packaging for symptom relief.  You may also use ibuprofen or tylenol as directed on packaging for pain or fever.  Do not take multiple medicines containing Tylenol or acetaminophen to avoid taking too much of this medication.  Follow-up instructions: Please follow-up with your primary care provider in the next 3 days for further evaluation of your symptoms if you are not feeling better.   Return instructions:  Please return to the Emergency Department if you experience worsening symptoms.  RETURN IMMEDIATELY IF you develop shortness of breath, confusion or altered mental status, a new rash, become dizzy, faint, or poorly responsive, or are unable to be cared for at home. Please return if you have persistent vomiting and cannot keep down fluids or develop a fever that is not controlled by tylenol or motrin.   Please return if you have any other emergent concerns.  Additional Information:  Your vital signs today were: BP (!) 122/93   Pulse 74   Temp 98.7 F (37.1 C) (Oral)   Resp 18   Ht 5\' 8"  (1.727 m)   Wt 99.8 kg  SpO2 96%   BMI 33.45 kg/m  If your blood pressure (BP) was elevated above 135/85 this visit, please have this repeated by your doctor within one month. --------------

## 2023-12-11 ENCOUNTER — Encounter (HOSPITAL_BASED_OUTPATIENT_CLINIC_OR_DEPARTMENT_OTHER): Payer: Self-pay

## 2023-12-11 ENCOUNTER — Other Ambulatory Visit: Payer: Self-pay

## 2023-12-11 ENCOUNTER — Emergency Department (HOSPITAL_BASED_OUTPATIENT_CLINIC_OR_DEPARTMENT_OTHER)
Admission: EM | Admit: 2023-12-11 | Discharge: 2023-12-11 | Disposition: A | Discharge status: Home / Self Care | Payer: Self-pay | Attending: Emergency Medicine | Admitting: Emergency Medicine

## 2023-12-11 DIAGNOSIS — N4889 Other specified disorders of penis: Secondary | ICD-10-CM | POA: Insufficient documentation

## 2023-12-11 DIAGNOSIS — J45909 Unspecified asthma, uncomplicated: Secondary | ICD-10-CM | POA: Insufficient documentation

## 2023-12-11 DIAGNOSIS — R350 Frequency of micturition: Secondary | ICD-10-CM | POA: Insufficient documentation

## 2023-12-11 DIAGNOSIS — Z202 Contact with and (suspected) exposure to infections with a predominantly sexual mode of transmission: Secondary | ICD-10-CM | POA: Insufficient documentation

## 2023-12-11 DIAGNOSIS — Z7951 Long term (current) use of inhaled steroids: Secondary | ICD-10-CM | POA: Insufficient documentation

## 2023-12-11 LAB — URINALYSIS, ROUTINE W REFLEX MICROSCOPIC
Bilirubin Urine: NEGATIVE
Glucose, UA: NEGATIVE mg/dL
Hgb urine dipstick: NEGATIVE
Ketones, ur: NEGATIVE mg/dL
Leukocytes,Ua: NEGATIVE
Nitrite: NEGATIVE
Protein, ur: NEGATIVE mg/dL
Specific Gravity, Urine: 1.02 (ref 1.005–1.030)
pH: 7 (ref 5.0–8.0)

## 2023-12-11 MED ORDER — METRONIDAZOLE 500 MG PO TABS: 2000.0000 mg | ORAL_TABLET | Freq: Once | ORAL | 0 refills | Status: AC

## 2023-12-11 NOTE — ED Provider Notes (Signed)
 [facility] Provider Note   CSN: [id number] Arrival date & time: 12/11/23  1307     History  Chief Complaint  Patient presents with   Penile Irritation    John Johnson is a 27 y.o. male.  HPI   27 year old presents emergency department with complaints of penile irritation as well as urinary frequency.  States has been having symptoms for the past month or so.  Reports symptoms worsen whenever he urinates.  Denies abdominal pain, appreciable rash, penile discharge.  States that one of his sexual partners tested positive for trichomonas is concerned that he is contracted the same.  Denies any known other STD exposure.  Past medical history significant for asthma  Home Medications Prior to Admission medications   Medication Sig Start Date End Date Taking? Authorizing Provider  metroNIDAZOLE  (FLAGYL ) 500 MG tablet Take 4 tablets (2,000 mg total) by mouth once for 1 dose. 12/11/23 12/11/23 Yes Silver Wonda LABOR, PA  albuterol  (VENTOLIN  HFA) 108 (90 Base) MCG/ACT inhaler Inhale 2 puffs into the lungs every 4 (four) hours as needed for wheezing or shortness of breath. 10/19/23   Geiple, Joshua, PA-C  benzonatate  (TESSALON ) 100 MG capsule Take 1 capsule (100 mg total) by mouth every 8 (eight) hours. 10/19/23   Desiderio Chew, PA-C      Allergies    Patient has no known allergies.    Review of Systems   Review of Systems  All other systems reviewed and are negative.   Physical Exam Updated Vital Signs BP (!) 127/91 (BP Location: Right Arm)   Pulse 66   Temp 98.2 F (36.8 C) (Oral)   Resp 18   Ht 5' 8 (1.727 m)   Wt 101.6 kg   SpO2 99%   BMI 34.06 kg/m  Physical Exam Vitals and nursing note reviewed.  Constitutional:      General: He is not in acute distress.    Appearance: He is well-developed.  HENT:     Head: Normocephalic and atraumatic.  Eyes:     Conjunctiva/sclera: Conjunctivae normal.  Cardiovascular:     Rate  and Rhythm: Normal rate and regular rhythm.     Heart sounds: No murmur heard. Pulmonary:     Effort: Pulmonary effort is normal. No respiratory distress.     Breath sounds: Normal breath sounds.  Abdominal:     Palpations: Abdomen is soft.     Tenderness: There is no abdominal tenderness. There is no guarding.  Musculoskeletal:        General: No swelling.     Cervical back: Neck supple.  Skin:    General: Skin is warm and dry.     Capillary Refill: Capillary refill takes less than 2 seconds.  Neurological:     Mental Status: He is alert.  Psychiatric:        Mood and Affect: Mood normal.     ED Results / Procedures / Treatments   Labs (all labs ordered are listed, but only abnormal results are displayed) Labs Reviewed  URINALYSIS, ROUTINE W REFLEX MICROSCOPIC  GC/CHLAMYDIA PROBE AMP ([place]) NOT AT [facility]    EKG None  Radiology No results found.  Procedures Procedures    Medications Ordered in ED Medications - No data to display  ED Course/ Medical Decision Making/ A&P  Medical Decision Making Amount and/or Complexity of Data Reviewed Labs: ordered.   This patient presents to the ED for concern of STD exposure, this involves an extensive number of treatment options, and is a complaint that carries with it a high risk of complications and morbidity.  The differential diagnosis includes gonorrhea, chlamydia, UTI, trichomoniasis, RPR, syphilis, other   Co morbidities that complicate the patient evaluation  See HPI   Additional history obtained:  Additional history obtained from EMR External records from outside source obtained and reviewed including hospital records   Lab Tests:  I Ordered, and personally interpreted labs.  The pertinent results include: UA without abnormality.  GC/chlamydia pending   Imaging Studies ordered:  N/a   Cardiac Monitoring: / EKG:  The patient was maintained on a cardiac monitor.   I personally viewed and interpreted the cardiac monitored which showed an underlying rhythm of: Sinus rhythm   Consultations Obtained:  N/a   Problem List / ED Course / Critical interventions / Medication management  STD exposure Reevaluation of the patient showed that the patient stayed the same I have reviewed the patients home medicines and have made adjustments as needed   Social Determinants of Health:  Denies tobacco, licit drug use   Test / Admission - Considered:  STD exposure Vitals signs within normal range and stable throughout visit. Laboratory studies significant for: See above 27 year old male presents emergency department with complaints of increased urinary frequency as well as some irritation whenever he urinates.  No appreciable rash.  Patient with known exposure to trichomoniasis but partner tested negative for other STDs.  PE benign.  UA without abnormality.  Offered patient further workup via wet prep, HIV, RPR be declined at this time.  Will send additionally gonorrhea/chlamydia.  Requesting empiric treatment for trichomonas of which will be provided.  Will recommend follow-up with PCP/health department in the outpatient setting for reassessment.  Treatment plan discussed at length with patient and he acknowledged understanding was agreeable to said plan.  Patient overall well-appearing, afebrile in no acute distress. Worrisome signs and symptoms were discussed with the patient, and the patient acknowledged understanding to return to the ED if noticed. Patient was stable upon discharge.          Final Clinical Impression(s) / ED Diagnoses Final diagnoses:  STD exposure    Rx / DC Orders ED Discharge Orders          Ordered    metroNIDAZOLE  (FLAGYL ) 500 MG tablet   Once        12/11/23 1612              Silver Wonda LABOR, [place] 12/11/23 1619    Cottie Donnice PARAS, MD 12/12/23 [phone]

## 2023-12-11 NOTE — ED Triage Notes (Signed)
 Patient here POV from Home.  Notes Penile irritation that began almost a month ago. Had no concerns but sexual partner informed patient today that she had tested positive trichomoniasis.   No discernable dysuria. No N/V. No hematuria.   NAD Noted during Triage, A&Ox4, GCS 15. Ambulatory.

## 2023-12-11 NOTE — Discharge Instructions (Signed)
As discussed, will treat empirically for trichomonas.  One-time dose of antibiotics.  Recommend follow-up with your primary care for reassessment.  Please do not hesitate to return if the worrisome signs and symptoms we discussed become apparent.

## 2023-12-11 NOTE — ED Notes (Signed)

## 2023-12-13 LAB — GC/CHLAMYDIA PROBE AMP (~~LOC~~) NOT AT ARMC
Chlamydia: NEGATIVE
Comment: NEGATIVE
Comment: NORMAL
Neisseria Gonorrhea: NEGATIVE

## 2024-01-21 ENCOUNTER — Ambulatory Visit: Payer: Self-pay | Admitting: Physician Assistant

## 2024-01-21 ENCOUNTER — Other Ambulatory Visit (HOSPITAL_COMMUNITY)
Admission: RE | Admit: 2024-01-21 | Discharge: 2024-01-21 | Disposition: A | Discharge status: Home / Self Care | Payer: Managed Care, Other (non HMO) | Source: Ambulatory Visit | Attending: Physician Assistant | Admitting: Physician Assistant

## 2024-01-21 ENCOUNTER — Encounter: Payer: Self-pay | Admitting: Physician Assistant

## 2024-01-21 VITALS — BP 123/87 | Ht 68.0 in | Wt 225.0 lb

## 2024-01-21 DIAGNOSIS — Z113 Encounter for screening for infections with a predominantly sexual mode of transmission: Secondary | ICD-10-CM | POA: Diagnosis not present

## 2024-01-21 DIAGNOSIS — Z0000 Encounter for general adult medical examination without abnormal findings: Secondary | ICD-10-CM | POA: Diagnosis not present

## 2024-01-21 DIAGNOSIS — R6889 Other general symptoms and signs: Secondary | ICD-10-CM

## 2024-01-21 MED ORDER — ALBUTEROL SULFATE HFA 108 (90 BASE) MCG/ACT IN AERS: 2.0000 | INHALATION_SPRAY | RESPIRATORY_TRACT | 0 refills | Status: DC | PRN

## 2024-01-21 MED ORDER — FLUTICASONE PROPIONATE 50 MCG/ACT NA SUSP
2.0000 | Freq: Every day | NASAL | 6 refills | Status: AC
Start: 2024-01-21 — End: ?

## 2024-01-21 MED ORDER — BENZONATATE 100 MG PO CAPS: ORAL_CAPSULE | ORAL | 0 refills | Status: DC

## 2024-01-21 MED ORDER — ALBUTEROL SULFATE HFA 108 (90 BASE) MCG/ACT IN AERS: 2.0000 | INHALATION_SPRAY | RESPIRATORY_TRACT | 0 refills | Status: AC | PRN

## 2024-01-21 MED ORDER — FLUTICASONE PROPIONATE 50 MCG/ACT NA SUSP: 2.0000 | Freq: Every day | NASAL | 6 refills | Status: DC

## 2024-01-21 NOTE — Progress Notes (Signed)
 New Patient Office Visit  Subjective    Patient ID: John Johnson, male    DOB: Jan 14, 1996  Age: 28 y.o. MRN: [id number]  CC:  Chief Complaint  Patient presents with   URI   Discussed the use of AI scribe software for clinical note transcription with the patient, who gave verbal consent to proceed.  History of Present Illness         John Johnson, a 28 year old with a history of asthma, presents with a 5-day history of flu-like symptoms. He initially experienced body aches, headache, and chest congestion with coughing up yellowish phlegm. Over the next few days, his symptoms progressed to include chills, unmeasured fever, and increased body aches, leading to vomiting on two occasions. He also reports increased use of his albuterol  inhaler due to his symptoms. He has been self-medicating with over-the-counter remedies such as Nyquil and Theraflu, which he reports have helped manage his symptoms.  In addition to his flu-like symptoms, John Johnson reports a sharp, intermittent pain at the tip of his penis for the past three days. The pain is not associated with urination. He also mentions a recent exposure to an STI, for which he took antibiotics, although he does not specify which STI. He requests testing for STIs during this visit.    Outpatient Encounter Medications as of 01/21/2024  Medication Sig   [DISCONTINUED] benzonatate  (TESSALON ) 100 MG capsule Take 1-2 caps PO TID PRN   [DISCONTINUED] fluticasone  (FLONASE ) 50 MCG/ACT nasal spray Place 2 sprays into both nostrils daily.   albuterol  (VENTOLIN  HFA) 108 (90 Base) MCG/ACT inhaler Inhale 2 puffs into the lungs every 4 (four) hours as needed for wheezing or shortness of breath.   benzonatate  (TESSALON ) 100 MG capsule Take 1-2 caps PO TID PRN   fluticasone  (FLONASE ) 50 MCG/ACT nasal spray Place 2 sprays into both nostrils daily.   [DISCONTINUED] albuterol  (VENTOLIN  HFA) 108 (90 Base) MCG/ACT inhaler Inhale 2 puffs into the lungs every 4  (four) hours as needed for wheezing or shortness of breath.   [DISCONTINUED] albuterol  (VENTOLIN  HFA) 108 (90 Base) MCG/ACT inhaler Inhale 2 puffs into the lungs every 4 (four) hours as needed for wheezing or shortness of breath.   [DISCONTINUED] benzonatate  (TESSALON ) 100 MG capsule Take 1 capsule (100 mg total) by mouth every 8 (eight) hours.   No facility-administered encounter medications on file as of 01/21/2024.    Past Medical History:  Diagnosis Date   Asthma    very mild -- rare albuterol  use   Eczema     Past Surgical History:  Procedure Laterality Date   NO PAST SURGERIES      Family History  Problem Relation Age of Onset   Healthy Mother    Healthy Father    Healthy Brother        x 4   Healthy Sister        x 2    Social History   Socioeconomic History   Marital status: Single    Spouse name: Not on file   Number of children: 0   Years of education: Not on file   Highest education level: Not on file  Occupational History   Occupation: Student    Comment: Primary school teacher  Tobacco Use   Smoking status: Never   Smokeless tobacco: Never  Vaping Use   Vaping status: Some Days  Substance and Sexual Activity   Alcohol use: Yes    Comment: Occ   Drug use: Yes  Types: Marijuana   Sexual activity: Yes    Partners: Female    Comment: condoms  Other Topics Concern   Not on file  Social History Narrative   Not on file   Social Drivers of Health   Financial Resource Strain: Not on file  Food Insecurity: Not on file  Transportation Needs: Not on file  Physical Activity: Not on file  Stress: Not on file  Social Connections: Not on file  Intimate Partner Violence: Not on file    Review of Systems  Constitutional:  Positive for chills, fever and malaise/fatigue.  HENT:  Positive for congestion. Negative for ear discharge, ear pain, sinus pain and sore throat.   Eyes: Negative.   Respiratory:  Positive for cough and sputum production. Negative for  shortness of breath and wheezing.   Cardiovascular:  Negative for chest pain.  Gastrointestinal:  Positive for vomiting. Negative for nausea.  Genitourinary:  Negative for dysuria, frequency and hematuria.  Musculoskeletal: Negative.   Neurological: Negative.   Endo/Heme/Allergies: Negative.   Psychiatric/Behavioral: Negative.          Objective    BP 123/87 (BP Location: Left Arm, Patient Position: Sitting, Cuff Size: Large)   Ht 5\' 8"  (1.727 m)   Wt 225 lb (102.1 kg)   SpO2 98%   BMI 34.21 kg/m   Physical Exam Vitals and nursing note reviewed.  Constitutional:      Appearance: Normal appearance.  HENT:     Head: Normocephalic and atraumatic.     Salivary Glands: Right salivary gland is not diffusely enlarged or tender. Left salivary gland is not diffusely enlarged or tender.     Right Ear: Tympanic membrane, ear canal and external ear normal.     Left Ear: Tympanic membrane, ear canal and external ear normal.     Nose: Nose normal.     Right Turbinates: Enlarged and swollen.     Left Turbinates: Enlarged and swollen.     Right Sinus: No maxillary sinus tenderness or frontal sinus tenderness.     Left Sinus: No maxillary sinus tenderness or frontal sinus tenderness.  Cardiovascular:     Rate and Rhythm: Normal rate and regular rhythm.     Pulses: Normal pulses.     Heart sounds: Normal heart sounds.  Pulmonary:     Effort: Pulmonary effort is normal.     Breath sounds: Normal breath sounds.  Musculoskeletal:        General: Normal range of motion.     Cervical back: Normal range of motion and neck supple.  Skin:    General: Skin is warm and dry.  Neurological:     General: No focal deficit present.     Mental Status: He is alert and oriented to person, place, and time.  Psychiatric:        Mood and Affect: Mood normal.        Behavior: Behavior normal.        Thought Content: Thought content normal.        Judgment: Judgment normal.         Assessment &  Plan:   Problem List Items Addressed This Visit   None Visit Diagnoses       Flu-like symptoms    -  Primary   Relevant Medications   albuterol  (VENTOLIN  HFA) 108 (90 Base) MCG/ACT inhaler   benzonatate  (TESSALON ) 100 MG capsule   fluticasone  (FLONASE ) 50 MCG/ACT nasal spray     Wellness examination  Relevant Orders   CBC with Differential/Platelet   Comp. Metabolic Panel (12)   Lipid panel     Screen for sexually transmitted diseases       Relevant Orders   Urine cytology ancillary only   HIV antibody (with reflex)   RPR      1. Flu-like symptoms (Primary) Influenza-like Illness Symptoms of fever, body aches, cough with yellow phlegm, and fatigue. No recent known sick contacts. Symptoms improved with rest, hydration, and over-the-counter medications. -Refill Albuterol  inhaler for asthma exacerbation. -Prescribe cough suppressant for lingering cough. -Prescribe Flonase  for nasal congestion. - albuterol  (VENTOLIN  HFA) 108 (90 Base) MCG/ACT inhaler; Inhale 2 puffs into the lungs every 4 (four) hours as needed for wheezing or shortness of breath.  Dispense: 1 each; Refill: 0 - benzonatate  (TESSALON ) 100 MG capsule; Take 1-2 caps PO TID PRN  Dispense: 20 capsule; Refill: 0 - fluticasone  (FLONASE ) 50 MCG/ACT nasal spray; Place 2 sprays into both nostrils daily.  Dispense: 16 g; Refill: 6  2. Wellness examination  - CBC with Differential/Platelet - Comp. Metabolic Panel (12) - Lipid panel  3. Screen for sexually transmitted diseases  - Urine cytology ancillary only - HIV antibody (with reflex) - RPR   I have reviewed the patient's medical history (PMH, PSH, Social History, Family History, Medications, and allergies) , and have been updated if relevant. I spent 30 minutes reviewing chart and  face to face time with patient.   Return if symptoms worsen or fail to improve.   Etter Hermann Mayers, PA-C

## 2024-01-21 NOTE — Patient Instructions (Addendum)
 VISIT SUMMARY:  Mr. John Johnson, during your visit today, we discussed your recent flu-like symptoms. You have been experiencing body aches, headache, chest congestion, chills, fever, and vomiting. Additionally, you reported a sharp pain at the tip of your penis and requested STI testing. We have addressed these concerns and provided a plan to help manage your symptoms and investigate the cause of your urethral pain.  YOUR PLAN:  -INFLUENZA-LIKE ILLNESS: You have symptoms consistent with the flu, including fever, body aches, cough with yellow phlegm, and fatigue. We recommend continuing rest, hydration, and over-the-counter medications. We have refilled your Albuterol  inhaler for asthma exacerbation, prescribed a cough suppressant for your lingering cough, and Flonase  for nasal congestion.  -URETHRAL PAIN: You are experiencing sharp, intermittent pain at the tip of your penis, which is not associated with urination. This could be related to a recent STI exposure. We have ordered urine tests for Gonorrhea, Chlamydia, and Trichomonas, as well as blood tests for HIV and Syphilis to determine the cause.  -STD SCREENING: You requested testing for sexually transmitted infections (STIs). We will perform the necessary urine and blood tests to screen for Gonorrhea, Chlamydia, Trichomonas, HIV, and Syphilis.  -GENERAL HEALTH MAINTENANCE: As part of your routine health maintenance, we have ordered a metabolic panel, lipid panel  and complete blood count (CBC) to check your overall health.  INSTRUCTIONS:  We will communicate the results of your tests via MyChart and a phone call. Please follow up with us  if your symptoms worsen or if you have any new concerns.  Upper Respiratory Infection, Adult An upper respiratory infection (URI) is a common viral infection of the nose, throat, and upper air passages that lead to the lungs. The most common type of URI is the common cold. URIs usually get better on their own,  without medical treatment. What are the causes? A URI is caused by a virus. You may catch a virus by: Breathing in droplets from an infected person's cough or sneeze. Touching something that has been exposed to the virus (is contaminated) and then touching your mouth, nose, or eyes. What increases the risk? You are more likely to get a URI if: You are very young or very old. You have close contact with others, such as at work, school, or a health care facility. You smoke. You have long-term (chronic) heart or lung disease. You have a weakened disease-fighting system (immune system). You have nasal allergies or asthma. You are experiencing a lot of stress. You have poor nutrition. What are the signs or symptoms? A URI usually involves some of the following symptoms: Runny or stuffy (congested) nose. Cough. Sneezing. Sore throat. Headache. Fatigue. Fever. Loss of appetite. Pain in your forehead, behind your eyes, and over your cheekbones (sinus pain). Muscle aches. Redness or irritation of the eyes. Pressure in the ears or face. How is this diagnosed? This condition may be diagnosed based on your medical history and symptoms, and a physical exam. Your health care provider may use a swab to take a mucus sample from your nose (nasal swab). This sample can be tested to determine what virus is causing the illness. How is this treated? URIs usually get better on their own within 7-10 days. Medicines cannot cure URIs, but your health care provider may recommend certain medicines to help relieve symptoms, such as: Over-the-counter cold medicines. Cough suppressants. Coughing is a type of defense against infection that helps to clear the respiratory system, so take these medicines only as recommended by your  health care provider. Fever-reducing medicines. Follow these instructions at home: Activity Rest as needed. If you have a fever, stay home from work or school until your fever is  gone or until your health care provider says your URI cannot spread to other people (is no longer contagious). Your health care provider may have you wear a face mask to prevent your infection from spreading. Relieving symptoms Gargle with a mixture of salt and water 3-4 times a day or as needed. To make salt water, completely dissolve -1 tsp (3-6 g) of salt in 1 cup (237 mL) of warm water. Use a cool-mist humidifier to add moisture to the air. This can help you breathe more easily. Eating and drinking  Drink enough fluid to keep your urine pale yellow. Eat soups and other clear broths. General instructions  Take over-the-counter and prescription medicines only as told by your health care provider. These include cold medicines, fever reducers, and cough suppressants. Do not use any products that contain nicotine or tobacco. These products include cigarettes, chewing tobacco, and vaping devices, such as e-cigarettes. If you need help quitting, ask your health care provider. Stay away from secondhand smoke. Stay up to date on all immunizations, including the yearly (annual) flu vaccine. Keep all follow-up visits. This is important. How to prevent the spread of infection to others URIs can be contagious. To prevent the infection from spreading: Wash your hands with soap and water for at least 20 seconds. If soap and water are not available, use hand sanitizer. Avoid touching your mouth, face, eyes, or nose. Cough or sneeze into a tissue or your sleeve or elbow instead of into your hand or into the air.  Contact a health care provider if: You are getting worse instead of better. You have a fever or chills. Your mucus is brown or red. You have yellow or brown discharge coming from your nose. You have pain in your face, especially when you bend forward. You have swollen neck glands. You have pain while swallowing. You have white areas in the back of your throat. Get help right away if: You  have shortness of breath that gets worse. You have severe or persistent: Headache. Ear pain. Sinus pain. Chest pain. You have chronic lung disease along with any of the following: Making high-pitched whistling sounds when you breathe, most often when you breathe out (wheezing). Prolonged cough (more than 14 days). Coughing up blood. A change in your usual mucus. You have a stiff neck. You have changes in your: Vision. Hearing. Thinking. Mood. These symptoms may be an emergency. Get help right away. Call 911. Do not wait to see if the symptoms will go away. Do not drive yourself to the hospital. Summary An upper respiratory infection (URI) is a common infection of the nose, throat, and upper air passages that lead to the lungs. A URI is caused by a virus. URIs usually get better on their own within 7-10 days. Medicines cannot cure URIs, but your health care provider may recommend certain medicines to help relieve symptoms. This information is not intended to replace advice given to you by your health care provider. Make sure you discuss any questions you have with your health care provider. Document Revised: 06/29/2021 Document Reviewed: 06/29/2021 Elsevier Patient Education  2024 [employer].

## 2024-01-22 LAB — URINE CYTOLOGY ANCILLARY ONLY
Chlamydia: NEGATIVE
Comment: NEGATIVE
Comment: NEGATIVE
Comment: NORMAL
Neisseria Gonorrhea: NEGATIVE
Trichomonas: NEGATIVE

## 2024-01-24 ENCOUNTER — Encounter: Payer: Self-pay | Admitting: Physician Assistant

## 2024-01-24 LAB — COMP. METABOLIC PANEL (12)
AST: 23 [IU]/L (ref 0–40)
Albumin: 4.5 g/dL (ref 4.3–5.2)
Alkaline Phosphatase: 75 [IU]/L (ref 44–121)
BUN/Creatinine Ratio: 8 — ABNORMAL LOW (ref 9–20)
BUN: 9 mg/dL (ref 6–20)
Bilirubin Total: 0.2 mg/dL (ref 0.0–1.2)
Calcium: 8.9 mg/dL (ref 8.7–10.2)
Chloride: 104 mmol/L (ref 96–106)
Creatinine, Ser: 1.08 mg/dL (ref 0.76–1.27)
Globulin, Total: 2.4 g/dL (ref 1.5–4.5)
Glucose: 102 mg/dL — ABNORMAL HIGH (ref 70–99)
Potassium: 4 mmol/L (ref 3.5–5.2)
Sodium: 140 mmol/L (ref 134–144)
Total Protein: 6.9 g/dL (ref 6.0–8.5)
eGFR: 96 mL/min/{1.73_m2} (ref >59)

## 2024-01-24 LAB — CBC WITH DIFFERENTIAL/PLATELET
Basophils Absolute: 0.1 10*3/uL (ref 0.0–0.2)
Basos: 1 %
EOS (ABSOLUTE): 0.1 10*3/uL (ref 0.0–0.4)
Eos: 2 %
Hematocrit: 47 % (ref 37.5–51.0)
Hemoglobin: 15.7 g/dL (ref 13.0–17.7)
Immature Grans (Abs): 0 10*3/uL (ref 0.0–0.1)
Immature Granulocytes: 0 %
Lymphocytes Absolute: 1.5 10*3/uL (ref 0.7–3.1)
Lymphs: 41 %
MCH: 28.4 pg (ref 26.6–33.0)
MCHC: 33.4 g/dL (ref 31.5–35.7)
MCV: 85 fL (ref 79–97)
Monocytes Absolute: 0.8 10*3/uL (ref 0.1–0.9)
Monocytes: 21 %
Neutrophils Absolute: 1.3 10*3/uL — ABNORMAL LOW (ref 1.4–7.0)
Neutrophils: 35 %
Platelets: 265 10*3/uL (ref 150–450)
RBC: 5.52 x10E6/uL (ref 4.14–5.80)
RDW: 13.6 % (ref 11.6–15.4)
WBC: 3.6 10*3/uL (ref 3.4–10.8)

## 2024-01-24 LAB — LIPID PANEL
Chol/HDL Ratio: 4.5 {ratio} (ref 0.0–5.0)
Cholesterol, Total: 131 mg/dL (ref 100–199)
HDL: 29 mg/dL — ABNORMAL LOW (ref >39)
LDL Chol Calc (NIH): 90 mg/dL (ref 0–99)
Triglycerides: 58 mg/dL (ref 0–149)
VLDL Cholesterol Cal: 12 mg/dL (ref 5–40)

## 2024-01-24 LAB — HIV ANTIBODY (ROUTINE TESTING W REFLEX): HIV Screen 4th Generation wRfx: NONREACTIVE

## 2024-01-24 LAB — RPR: RPR Ser Ql: NONREACTIVE

## 2024-03-23 ENCOUNTER — Other Ambulatory Visit: Payer: Self-pay

## 2024-03-23 ENCOUNTER — Encounter (HOSPITAL_BASED_OUTPATIENT_CLINIC_OR_DEPARTMENT_OTHER): Payer: Self-pay

## 2024-03-23 ENCOUNTER — Emergency Department (HOSPITAL_BASED_OUTPATIENT_CLINIC_OR_DEPARTMENT_OTHER)
Admission: EM | Admit: 2024-03-23 | Discharge: 2024-03-23 | Disposition: A | Discharge status: Home / Self Care | Attending: Emergency Medicine | Admitting: Emergency Medicine

## 2024-03-23 DIAGNOSIS — W503XXA Accidental bite by another person, initial encounter: Secondary | ICD-10-CM | POA: Diagnosis not present

## 2024-03-23 DIAGNOSIS — Y93E5 Activity, floor mopping and cleaning: Secondary | ICD-10-CM | POA: Insufficient documentation

## 2024-03-23 DIAGNOSIS — S01511A Laceration without foreign body of lip, initial encounter: Secondary | ICD-10-CM | POA: Insufficient documentation

## 2024-03-23 DIAGNOSIS — Y92019 Unspecified place in single-family (private) house as the place of occurrence of the external cause: Secondary | ICD-10-CM | POA: Insufficient documentation

## 2024-03-23 DIAGNOSIS — S0993XA Unspecified injury of face, initial encounter: Secondary | ICD-10-CM | POA: Diagnosis present

## 2024-03-23 MED ORDER — CHLORHEXIDINE GLUCONATE 0.12 % MT SOLN: 15.0000 mL | Freq: Two times a day (BID) | OROMUCOSAL | 0 refills | Status: AC

## 2024-03-23 MED ORDER — AMOXICILLIN-POT CLAVULANATE 875-125 MG PO TABS: 1.0000 | ORAL_TABLET | Freq: Two times a day (BID) | ORAL | 0 refills | Status: DC
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
## 2024-03-23 NOTE — Discharge Instructions (Addendum)
 You were seen in the ER today for evaluation of your lip laceration. Unfortunately, given that it has been over 24 hours, we can not repair this with stitches as this would increase risk of infection. This will have to heal on its own. You need to make sure that the wounds stay clean with Dial soap and water for the outer laceration. I am sending you home with a special antiseptic mouthwash to use twice a day for the next 4 days as well. You can use ice on your lip for 15 minutes every few hours as needed for pain. You can take 1000mg  of Tylenol and 600mg  of ibuprofen every 6 hours as needed for pain. Please follow up with your PCP in the next few days for re-evaluation. If you have any concerns, new or worsening symptoms, please return to the nearest ER for re-evaluation.   Contact a health care provider if: Your pain is not controlled with medicine. You have: A fever or chills. Redness, swelling, or pain on your wound, and it is getting worse. Fresh bleeding or pus coming from your wound. Swollen or tender glands in your throat. Get help right away if: The edges of your wound break open. Your face or the area under your jaw becomes swollen. You have trouble breathing or swallowing. These symptoms may be an emergency. Get help right away. Call 911. Do not wait to see if the symptoms will go away. Do not drive yourself to the hospital.
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
## 2024-04-21 ENCOUNTER — Encounter (HOSPITAL_BASED_OUTPATIENT_CLINIC_OR_DEPARTMENT_OTHER): Payer: Self-pay

## 2024-04-21 ENCOUNTER — Other Ambulatory Visit: Payer: Self-pay

## 2024-04-21 ENCOUNTER — Emergency Department (HOSPITAL_BASED_OUTPATIENT_CLINIC_OR_DEPARTMENT_OTHER)
Admission: EM | Admit: 2024-04-21 | Discharge: 2024-04-21 | Disposition: A | Discharge status: Home / Self Care | Attending: Emergency Medicine | Admitting: Emergency Medicine

## 2024-04-21 DIAGNOSIS — R300 Dysuria: Secondary | ICD-10-CM | POA: Diagnosis not present

## 2024-04-21 DIAGNOSIS — S76301A Unspecified injury of muscle, fascia and tendon of the posterior muscle group at thigh level, right thigh, initial encounter: Secondary | ICD-10-CM | POA: Insufficient documentation

## 2024-04-21 DIAGNOSIS — W1830XA Fall on same level, unspecified, initial encounter: Secondary | ICD-10-CM | POA: Insufficient documentation

## 2024-04-21 DIAGNOSIS — M79604 Pain in right leg: Secondary | ICD-10-CM | POA: Diagnosis present

## 2024-04-21 LAB — URINALYSIS, ROUTINE W REFLEX MICROSCOPIC
Bilirubin Urine: NEGATIVE
Glucose, UA: NEGATIVE mg/dL
Hgb urine dipstick: NEGATIVE
Ketones, ur: NEGATIVE mg/dL
Leukocytes,Ua: NEGATIVE
Nitrite: NEGATIVE
Protein, ur: NEGATIVE mg/dL
Specific Gravity, Urine: >=1.03 (ref 1.005–1.030)
pH: 5.5 (ref 5.0–8.0)

## 2024-04-21 MED ORDER — NAPROXEN 500 MG PO TABS: 500.0000 mg | ORAL_TABLET | Freq: Two times a day (BID) | ORAL | 0 refills | Status: DC

## 2024-04-21 NOTE — ED Triage Notes (Signed)
 Pt arrives ambulatory to ED with c/o pain to right leg since a fall in April. Also reports cloudy and bubbly urine. Denies any pain with urination. Pt is sexually active but reports using condoms.

## 2024-04-21 NOTE — Discharge Instructions (Signed)
 Please read and follow all provided instructions.  Your diagnoses today include:  1. Dysuria   2. Right hamstring injury, initial encounter     Tests performed today include: Urine test: Was clear Testing for gonorrhea/chlamydia: Pending, results will be available in MyChart in 24 to 48 hours, you will be notified with a positive result Vital signs. See below for your results today.   Medications prescribed:  Naproxen - anti-inflammatory pain medication Do not exceed 500mg  naproxen every 12 hours, take with food  You have been prescribed an anti-inflammatory medication or NSAID. Take with food. Take smallest effective dose for the shortest duration needed for your pain. Stop taking if you experience stomach pain or vomiting.   Take any prescribed medications only as directed.  Home care instructions:  Follow any educational materials contained in this packet.  BE VERY CAREFUL not to take multiple medicines containing Tylenol  (also called acetaminophen ). Doing so can lead to an overdose which can damage your liver and cause liver failure and possibly death.   Follow-up instructions: Please follow-up with sports medicine in 1 week if you continue to have significant pain behind your knee.  Return instructions:  Please return to the Emergency Department if you experience worsening symptoms.  Please return if you have any other emergent concerns.  Additional Information:  Your vital signs today were: BP 127/77 (BP Location: Left Arm)   Pulse 63   Temp 98 F (36.7 C) (Oral)   Resp 16   Ht 5\' 8"  (1.727 m)   Wt 99.8 kg   SpO2 98%   BMI 33.45 kg/m  If your blood pressure (BP) was elevated above 135/85 this visit, please have this repeated by your doctor within one month. --------------

## 2024-04-21 NOTE — ED Provider Notes (Signed)
 [place] [facility] Provider Note   CSN: [id number] Arrival date & time: 04/21/24  1024     History  Chief Complaint  Patient presents with   Leg Pain   Urinary Frequency    John Johnson is a 28 y.o. male.  Patient presents to the emergency department for evaluation of 1 to 2 weeks of cloudy urine, different than what he is used to.  He will have some stinging pains at the tip of the penis at times.  Not constantly.  No penile discharge or drainage.  No genital sores or ulcers.  He has been using Azo which helps.  He is sexually active, uses protection.  Also reports right leg pain, posterior to the knee.  This has been present for about a month.  He feels it more when he stretches.  He is ambulatory.  Pain does not stop him from doing anything.  He initially injured it about a month ago when he fell.  Patient reports being intoxicated and cannot remember exactly what happened to him.  He also had a facial laceration which he was seen for on 03/23/2024.      Home Medications Prior to Admission medications   Medication Sig Start Date End Date Taking? Authorizing Provider  albuterol  (VENTOLIN  HFA) 108 (90 Base) MCG/ACT inhaler Inhale 2 puffs into the lungs every 4 (four) hours as needed for wheezing or shortness of breath. 01/21/24   Mayers, Cari S, PA-C  amoxicillin -clavulanate (AUGMENTIN ) 875-125 MG tablet Take 1 tablet by mouth every 12 (twelve) hours. 03/23/24   Spence Dux, PA-C  benzonatate  (TESSALON ) 100 MG capsule Take 1-2 caps PO TID PRN 01/21/24   Mayers, Cari S, PA-C  fluticasone  (FLONASE ) 50 MCG/ACT nasal spray Place 2 sprays into both nostrils daily. 01/21/24   Mayers, Cari S, PA-C      Allergies    Patient has no known allergies.    Review of Systems   Review of Systems  Physical Exam Updated Vital Signs BP 127/77 (BP Location: Left Arm)   Pulse 63   Temp 98 F (36.7 C) (Oral)   Resp 16   Ht 5\' 8"  (1.727 m)   Wt 99.8 kg    SpO2 98%   BMI 33.45 kg/m  Physical Exam Vitals and nursing note reviewed.  Constitutional:      Appearance: He is well-developed.  HENT:     Head: Normocephalic and atraumatic.  Eyes:     Conjunctiva/sclera: Conjunctivae normal.  Pulmonary:     Effort: No respiratory distress.  Musculoskeletal:     Cervical back: Normal range of motion and neck supple.     Right hip: Normal range of motion.     Left hip: Normal range of motion.     Right knee: No bony tenderness. Normal range of motion. Tenderness present.     Left knee: No bony tenderness. Normal range of motion. No tenderness.     Right ankle: No tenderness. Normal range of motion.     Left ankle: No tenderness. Normal range of motion.       Legs:  Skin:    General: Skin is warm and dry.  Neurological:     Mental Status: He is alert.    ED Results / Procedures / Treatments   Labs (all labs ordered are listed, but only abnormal results are displayed) Labs Reviewed - No data to display  EKG None  Radiology No results found.  Procedures Procedures  Medications Ordered in ED Medications - No data to display  ED Course/ Medical Decision Making/ A&P    Patient seen and examined. History obtained directly from patient.   Labs/EKG: Ordered UA, GC/chlamydia.  Imaging: None ordered  Medications/Fluids: None ordered  Most recent vital signs reviewed and are as follows: BP 127/77 (BP Location: Left Arm)   Pulse 63   Temp 98 F (36.7 C) (Oral)   Resp 16   Ht 5\' 8"  (1.727 m)   Wt 99.8 kg   SpO2 98%   BMI 33.45 kg/m   Initial impression: Evaluate for UTI/STD.  Knee pain, likely musculoskeletal, possibly tendinitis at the distal insertion of the hamstring/lateral tendon. Recc NSAIDs, RICE, sports med follow-up if needed.   ///  Reassessment performed. Patient appears stable, comfortable.  Labs personally reviewed and interpreted including: UA is clear.  Reviewed pertinent lab work and imaging with  patient at bedside. Questions answered.  He has access to MyChart and we will follow-up on his GC/committee a testing.  Most current vital signs reviewed and are as follows: BP 127/77 (BP Location: Left Arm)   Pulse 63   Temp 98 F (36.7 C) (Oral)   Resp 16   Ht 5\' 8"  (1.727 m)   Wt 99.8 kg   SpO2 98%   BMI 33.45 kg/m   Plan: Discharge to home.   Prescriptions written for: Naproxen  Other home care instructions discussed: RICE protocol for injury, follow-up with sports medicine in 1 week if not getting better  ED return instructions discussed: New or worsening symptoms                               Medical Decision Making Amount and/or Complexity of Data Reviewed Labs: ordered.   Patient with cloudy urine, some mild dysuria: UA negative.  GC/chlamydia testing is pending.  Abdominal exam reassuring.  Patient also with right knee pain, posterior, since injury about a month ago.  Will continue routine care, sports medicine follow-up as needed.  His exam is normal.  No evidence of compartment syndrome.  Distal circulation, motor, sensation intact.        Final Clinical Impression(s) / ED Diagnoses Final diagnoses:  Dysuria  Right hamstring injury, initial encounter    Rx / DC Orders ED Discharge Orders          Ordered    naproxen (NAPROSYN) 500 MG tablet  2 times daily        04/21/24 1140              Doreatha Offer, PA-C 04/21/24 1306    Rafael Bun A, DO 04/23/24 1455

## 2024-04-22 LAB — GC/CHLAMYDIA PROBE AMP (~~LOC~~) NOT AT ARMC
Chlamydia: NEGATIVE
Comment: NEGATIVE
Comment: NORMAL
Neisseria Gonorrhea: NEGATIVE

## 2024-08-26 ENCOUNTER — Ambulatory Visit
Admission: EM | Admit: 2024-08-26 | Discharge: 2024-08-26 | Disposition: A | Discharge status: Home / Self Care | Attending: Family Medicine | Admitting: Family Medicine

## 2024-08-26 ENCOUNTER — Encounter: Payer: Self-pay | Admitting: Emergency Medicine

## 2024-08-26 DIAGNOSIS — M722 Plantar fascial fibromatosis: Secondary | ICD-10-CM

## 2024-08-26 MED ORDER — NAPROXEN 500 MG PO TABS: 500.0000 mg | ORAL_TABLET | Freq: Two times a day (BID) | ORAL | 0 refills | Status: AC

## 2024-08-26 NOTE — Discharge Instructions (Addendum)
 You were seen today for bilateral foot pain.  This may be plantar fasciitis.  I have given you information on this today.  I recommend doing home exercises as discussed and taking the anti-inflammatory for the next two weeks.  If not improving, please follow up with [facility] by calling [phone].

## 2024-08-26 NOTE — ED Triage Notes (Signed)
 Pt presents c/o foot pain x month and a half. Pt reports the pain covers entire bottom of both feet. Pt states it has been hard to walk and believes previous dx of slipped disk is contributing to the pain.

## 2024-08-26 NOTE — ED Provider Notes (Signed)
 John Johnson    CSN: [id number] Arrival date & time: 08/26/24  9188      History   Chief Complaint Chief Complaint  Patient presents with   Foot Pain    HPI [name] is a 28 y.o. male.    Foot Pain  Patient is here for bilateral foot pain x 6 weeks.  Pain to the bottom of the feet.  He does have a slipped disc, that bothers him here and there.    He works at a [employer], [employer].   Bothers him most in the am, or getting out of the Johnson.  It does improve the more he walks on them.        Past Medical History:  Diagnosis Date   Asthma    very mild -- rare albuterol  use   Eczema     Patient Active Problem List   Diagnosis Date Noted   Visit for preventive health examination 07/05/2016   Asthma, mild intermittent, well-controlled 07/09/2015   Acne vulgaris 07/09/2015    Past Surgical History:  Procedure Laterality Date   NO PAST SURGERIES         Home Medications    Prior to Admission medications   Medication Sig Start Date End Date Taking? Authorizing Provider  albuterol  (VENTOLIN  HFA) 108 (90 Base) MCG/ACT inhaler Inhale 2 puffs into the lungs every 4 (four) hours as needed for wheezing or shortness of breath. 01/21/24   Mayers, Cari S, PA-C  amoxicillin -clavulanate (AUGMENTIN ) 875-125 MG tablet Take 1 tablet by mouth every 12 (twelve) hours. 03/23/24   Bernis Ernst, PA-C  benzonatate  (TESSALON ) 100 MG capsule Take 1-2 caps PO TID PRN 01/21/24   Mayers, Cari S, PA-C  fluticasone  (FLONASE ) 50 MCG/ACT nasal spray Place 2 sprays into both nostrils daily. 01/21/24   Mayers, Cari S, PA-C  naproxen  (NAPROSYN ) 500 MG tablet Take 1 tablet (500 mg total) by mouth 2 (two) times daily. 04/21/24   Desiderio Chew, PA-C    Family History Family History  Problem Relation Age of Onset   Healthy Mother    Healthy Father    Healthy Sister        x 2   Healthy Brother        x 4    Social History Social History   Tobacco Use   Smoking  status: Never    Passive exposure: Never   Smokeless tobacco: Never  Vaping Use   Vaping status: Some Days  Substance Use Topics   Alcohol use: Yes    Comment: Occ   Drug use: Yes    Types: Marijuana     Allergies   Patient has no known allergies.   Review of Systems Review of Systems  Constitutional: Negative.   HENT: Negative.    Respiratory: Negative.    Cardiovascular: Negative.   Gastrointestinal: Negative.   Musculoskeletal:  Positive for gait problem.     Physical Exam Triage Vital Signs ED Triage Vitals  Encounter Vitals Group     BP 08/26/24 0904 117/79     Girls Systolic BP Percentile --      Girls Diastolic BP Percentile --      Boys Systolic BP Percentile --      Boys Diastolic BP Percentile --      Pulse Rate 08/26/24 0904 67     Resp 08/26/24 0904 18     Temp 08/26/24 0904 98.3 F (36.8 C)     Temp Source 08/26/24 0904  Oral     SpO2 08/26/24 0904 98 %     Weight 08/26/24 0904 220 lb 0.3 oz (99.8 kg)     Height --      Head Circumference --      Peak Flow --      Pain Score 08/26/24 0903 5     Pain Loc --      Pain Education --      Exclude from Growth Chart --    No data found.  Updated Vital Signs BP 117/79 (BP Location: Left Arm)   Pulse 67   Temp 98.3 F (36.8 C) (Oral)   Resp 18   Wt 99.8 kg   SpO2 98%   BMI 33.45 kg/m   Visual Acuity Right Eye Distance:   Left Eye Distance:   Bilateral Distance:    Right Eye Near:   Left Eye Near:    Bilateral Near:     Physical Exam Constitutional:      Appearance: Normal appearance. He is normal weight.  Musculoskeletal:     Comments: Slight TTP to the bottoms of the feet bilaterally;  slight pain with full dorsiflexion  Neurological:     General: No focal deficit present.     Mental Status: He is alert.  Psychiatric:        Mood and Affect: Mood normal.      UC Treatments / Results  Labs (all labs ordered are listed, but only abnormal results are displayed) Labs Reviewed  - No data to display  EKG   Radiology No results found.  Procedures Procedures (including critical Johnson time)  Medications Ordered in UC Medications - No data to display  Initial Impression / Assessment and Plan / UC Course  I have reviewed the triage vital signs and the nursing notes.  Pertinent labs & imaging results that were available during my Johnson of the patient were reviewed by me and considered in my medical decision making (see chart for details).   Final Clinical Impressions(s) / UC Diagnoses   Final diagnoses:  Plantar fasciitis, bilateral     Discharge Instructions      You were seen today for bilateral foot pain.  This may be plantar fasciitis.  I have given you information on this today.  I recommend doing home exercises as discussed and taking the anti-inflammatory for the next two weeks.  If not improving, please follow up with [facility] by calling [phone].     ED Prescriptions     Medication Sig Dispense Auth. Provider   naproxen  (NAPROSYN ) 500 MG tablet Take 1 tablet (500 mg total) by mouth 2 (two) times daily. 30 tablet Darral Longs, MD      PDMP not reviewed this encounter.   Darral Longs, MD 08/26/24 [phone]
